# Patient Record
Sex: Female | Born: 1990 | Race: White | Hispanic: No | Marital: Single | Smoking: Current every day smoker
Health system: Southern US, Community
[De-identification: ages and names within clinical notes are randomized; demographics above are authoritative.]

## PROBLEM LIST (undated history)

## (undated) ENCOUNTER — Inpatient Hospital Stay: Payer: Self-pay

## (undated) DIAGNOSIS — G43909 Migraine, unspecified, not intractable, without status migrainosus: Secondary | ICD-10-CM

## (undated) DIAGNOSIS — D649 Anemia, unspecified: Secondary | ICD-10-CM

## (undated) DIAGNOSIS — Z8489 Family history of other specified conditions: Secondary | ICD-10-CM

## (undated) DIAGNOSIS — F419 Anxiety disorder, unspecified: Secondary | ICD-10-CM

## (undated) DIAGNOSIS — F329 Major depressive disorder, single episode, unspecified: Secondary | ICD-10-CM

## (undated) DIAGNOSIS — F32A Depression, unspecified: Secondary | ICD-10-CM

---
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
## 2013-08-29 ENCOUNTER — Emergency Department: Payer: Self-pay | Admitting: Emergency Medicine

## 2013-11-16 ENCOUNTER — Emergency Department: Payer: Self-pay | Admitting: Emergency Medicine

## 2013-12-31 ENCOUNTER — Emergency Department: Payer: Self-pay | Admitting: Emergency Medicine

## 2014-01-03 LAB — BETA STREP CULTURE(ARMC)

## 2014-02-12 ENCOUNTER — Emergency Department: Payer: Self-pay | Admitting: Internal Medicine

## 2014-02-12 LAB — COMPREHENSIVE METABOLIC PANEL
ANION GAP: 2 — AB (ref 7–16)
Albumin: 4 g/dL (ref 3.4–5.0)
Alkaline Phosphatase: 85 U/L
BUN: 14 mg/dL (ref 7–18)
Bilirubin,Total: 1.1 mg/dL — ABNORMAL HIGH (ref 0.2–1.0)
CHLORIDE: 112 mmol/L — AB (ref 98–107)
Calcium, Total: 8.7 mg/dL (ref 8.5–10.1)
Co2: 25 mmol/L (ref 21–32)
Creatinine: 0.86 mg/dL (ref 0.60–1.30)
EGFR (Non-African Amer.): >60
Glucose: 76 mg/dL (ref 65–99)
Osmolality: 277 (ref 275–301)
Potassium: 3.4 mmol/L — ABNORMAL LOW (ref 3.5–5.1)
SGOT(AST): 20 U/L (ref 15–37)
SGPT (ALT): 15 U/L
Sodium: 139 mmol/L (ref 136–145)
TOTAL PROTEIN: 7.2 g/dL (ref 6.4–8.2)

## 2014-02-12 LAB — CBC
HCT: 40.6 % (ref 35.0–47.0)
HGB: 13.6 g/dL (ref 12.0–16.0)
MCH: 28.6 pg (ref 26.0–34.0)
MCHC: 33.4 g/dL (ref 32.0–36.0)
MCV: 86 fL (ref 80–100)
Platelet: 199 10*3/uL (ref 150–440)
RBC: 4.74 10*6/uL (ref 3.80–5.20)
RDW: 13.4 % (ref 11.5–14.5)
WBC: 9.3 10*3/uL (ref 3.6–11.0)

## 2014-02-12 LAB — TROPONIN I

## 2014-02-12 LAB — D-DIMER(ARMC): D-Dimer: 355 ng/ml

## 2014-04-14 ENCOUNTER — Emergency Department: Payer: Self-pay | Admitting: Emergency Medicine

## 2014-04-14 LAB — BASIC METABOLIC PANEL
ANION GAP: 10 (ref 7–16)
BUN: 10 mg/dL (ref 7–18)
CALCIUM: 8.9 mg/dL (ref 8.5–10.1)
CHLORIDE: 106 mmol/L (ref 98–107)
CO2: 22 mmol/L (ref 21–32)
Creatinine: 0.71 mg/dL (ref 0.60–1.30)
EGFR (Non-African Amer.): >60
GLUCOSE: 100 mg/dL — AB (ref 65–99)
OSMOLALITY: 275 (ref 275–301)
Potassium: 3.6 mmol/L (ref 3.5–5.1)
Sodium: 138 mmol/L (ref 136–145)

## 2014-04-14 LAB — CBC
HCT: 39.4 % (ref 35.0–47.0)
HGB: 12.8 g/dL (ref 12.0–16.0)
MCH: 28.4 pg (ref 26.0–34.0)
MCHC: 32.6 g/dL (ref 32.0–36.0)
MCV: 87 fL (ref 80–100)
Platelet: 167 10*3/uL (ref 150–440)
RBC: 4.53 10*6/uL (ref 3.80–5.20)
RDW: 12.9 % (ref 11.5–14.5)
WBC: 12.5 10*3/uL — ABNORMAL HIGH (ref 3.6–11.0)

## 2014-04-14 LAB — MONONUCLEOSIS SCREEN: MONO TEST: NEGATIVE

## 2014-09-21 NOTE — H&P (Signed)
L&D Evaluation:  History:   HPI 24 yo G1 at [redacted]w[redacted]d GA by LMP and 9 week u/s.  Pregnancy complicated by oligohydramnios.  She had preterm contractions in January and had a positive fetal fibronectin and received BMTZ x2. Patient has personal history of depression/anxiety, smoking 1/2 ppd.  She presents for induction for oligohydramnios.  She notes +FM, no VB, no LOF, no contractions. O+, RPR NR, RI, HBsAg neg.    Patient's Medical History migraines    Patient's Surgical History none    Medications Pre Natal Vitamins    Allergies NKDA    Social History tobacco  7-8 cigs/day    Family History Non-Contributory   ROS:   ROS negative unless noted in HPI   Exam:   General no apparent distress    Mental Status clear    Chest clear    Heart normal sinus rhythm    Abdomen gravid, non-tender    Estimated Fetal Weight about 6# by [place]    Back no CVAT    Edema no edema    Pelvic no external lesions, 1cm per RN    Mebranes Intact    FHT normal rate with no decels    FHT Description 135/mod var/+accels/no decels    Ucx irregular, 2-3 ctx q 10 min    Skin no lesions    Lymph no lymphadenopathy   Impression:   Impression Induction of labor for oligohydramnios   Plan:   Comments cervadil for ripening cbc/t&s ivf ambien for sleep start pit in AM   Electronic Signatures: Will Bonnet (MD)  (Signed 15-Mar-13 22:00)  Authored: L&D Evaluation   Last Updated: 15-Mar-13 22:00 by Will Bonnet (MD)

## 2014-09-21 NOTE — H&P (Signed)
L&D Evaluation:  History Expanded:   HPI 24 yo G1 P0, EDD of 08/04/11 per LMP and 19 week [place]. PNC at [facility] notable for teen pregnancy, smoking, ASB tx with Macrobid, iregular maternal HR with nl EKG and low AFI at 33weeks. Pt has been evaluated for several episodes of PTL in L&D and received steroids last week.  Pt presents today to L&D at 34 4/7 weeks with c/o contractions last night with lessened intensity today and decreased FM today. Denies LOF or VB. [facility] called while pt in hospital stating pt needed f/u AFI this week.    Blood Type O positive    Group B Strep Results (Result >5wks must be treated as unknown) unknown/result > 5 weeks ago     Maternal HIV Negative    Maternal Syphilis Ab Nonreactive    Maternal Varicella Immune    Rubella Results immune    Maternal T-Dap Immune    Patient's Medical History depression     Patient's Surgical History none     Medications Pre Natal Vitamins     Allergies NKDA    Social History tobacco    Exam:   Vital Signs stable     General no apparent distress    Mental Status clear     Chest clear     Heart no murmur/gallop/rubs    Abdomen gravid, non-tender    Estimated Fetal Weight Average for gestational age    Fetal Position vertex    Edema no edema     Pelvic no external lesions, cervix closed and thick    Mebranes Intact    FHT normal rate with no decels    Ucx initially regular, rare after rest and hydration    Other AFI 12.69 cm UA: negative   Impression:   Impression arrest of preterm contractions, normal AFI   Plan:   Comments D/c home Pt will f/u at [facility] tomorrow as scheduled.   Electronic Signatures: Ander Purpura (CNM)  (Signed 13-Feb-13 13:26)  Authored: L&D Evaluation   Last Updated: 13-Feb-13 13:26 by Ander Purpura (CNM)

## 2014-09-21 NOTE — H&P (Signed)
L&D Evaluation:  History:   HPI 24 year old G1P0 with EDD 08/04/11 per LMP.  Presents to L&D with main c/o cramping.  [place] [facility] for early entry to care and UTI that was treated with antibiotics.    Presents with abdominal pain    Patient's Medical History migraines, anxiety    Patient's Surgical History none    Medications Pre Natal Vitamins    Allergies NKDA    Social History none    Family History Non-Contributory   ROS:   ROS All systems were reviewed.  HEENT, CNS, GI, GU, Respiratory, CV, Renal and Musculoskeletal systems were found to be normal.   Exam:   Vital Signs stable    General no apparent distress, sleeping upon entering room    Mental Status clear    Chest clear    Heart normal sinus rhythm    Abdomen gravid, non-tender    Estimated Fetal Weight Average for gestational age    Back no CVAT    Edema no edema    Pelvic no external lesions, cervix closed and thick    Mebranes Intact    FHT normal rate with no decels    Ucx absent, 1 ctx noted when pt first arrived    Skin dry    Lymph no lymphadenopathy   Impression:   Impression other, normal discomforts of pregnancy   Plan:   Plan UA, EFM/NST, discharge    Comments While pt here her mother states that she has "chest pains" alot related to her anxiety but she won't take any medications.  She has been prescribed Buspar but is afraid to take it while pregnant.  Encouraged pt to take medication if she feels she needs it.  Also discussed nornal discomforts of pregnancy, staying hydrated and increasing fluid intake.    Follow Up Appointment already [facility]   Electronic Signatures: Shann Medal (CNM)  (Signed 09-Jan-13 21:29)  Authored: L&D Evaluation   Last Updated: 09-Jan-13 21:29 by Shann Medal (CNM)

## 2014-09-21 NOTE — H&P (Signed)
L&D Evaluation:  History Expanded:   HPI 24 yo G1 whose EDC = 08/04/11.  Pt presents with contractions and passage of "show".    Blood Type O positive    Maternal HIV Negative    Maternal Syphilis Ab Nonreactive    Rubella Results immune    EDC 04-Aug-2011    Presents with contractions    Patient's Medical History No Chronic Illness  recent antibiotics for "bronchitis"    Patient's Surgical History none    Medications Pre [place] Vitamins  antibiotic for bronchitis    Allergies NKDA    Social History tobacco   Exam:   Vital Signs stable    General no apparent distress    Mental Status clear    Chest clear    Heart normal sinus rhythm    Abdomen gravid, non-tender    Estimated Fetal Weight Average for gestational age    FHT normal rate with no decels    Ucx irregular   Plan:   Plan ?? PTL ??    Comments FFN, hydrate, observtion, Betamethasone   Electronic Signatures: Rosina Lowenstein (MD)  (Signed 05-Feb-13 14:03)  Authored: L&D Evaluation   Last Updated: 05-Feb-13 14:03 by Rosina Lowenstein (MD)

## 2014-12-15 ENCOUNTER — Encounter: Payer: Self-pay | Admitting: Emergency Medicine

## 2014-12-15 DIAGNOSIS — Z720 Tobacco use: Secondary | ICD-10-CM | POA: Insufficient documentation

## 2014-12-15 DIAGNOSIS — G43909 Migraine, unspecified, not intractable, without status migrainosus: Secondary | ICD-10-CM | POA: Insufficient documentation

## 2014-12-15 NOTE — ED Notes (Signed)
Patient ambulatory to triage with steady gait, without difficulty or distress noted; pt reports generalized HA x 3 days; taking excedrin migraine without relief which normally helps; st hx migraines

## 2014-12-16 ENCOUNTER — Emergency Department
Admission: EM | Admit: 2014-12-16 | Discharge: 2014-12-16 | Discharge status: Against Medical Advice | Payer: Self-pay | Attending: Emergency Medicine | Admitting: Emergency Medicine

## 2014-12-16 HISTORY — DX: Migraine, unspecified, not intractable, without status migrainosus: G43.909

## 2016-01-06 ENCOUNTER — Emergency Department
Admission: EM | Admit: 2016-01-06 | Discharge: 2016-01-06 | Disposition: A | Discharge status: Other Acute Inpatient Hospital | Payer: Medicaid Other | Attending: Emergency Medicine | Admitting: Emergency Medicine

## 2016-01-06 ENCOUNTER — Inpatient Hospital Stay
Admit: 2016-01-06 | Discharge: 2016-01-08 | DRG: 881 | Disposition: A | Discharge status: Home / Self Care | Payer: Medicaid Other | Source: Intra-hospital | Attending: Psychiatry | Admitting: Psychiatry

## 2016-01-06 ENCOUNTER — Emergency Department: Payer: Medicaid Other

## 2016-01-06 DIAGNOSIS — O0281 Inappropriate change in quantitative human chorionic gonadotropin (hCG) in early pregnancy: Secondary | ICD-10-CM | POA: Diagnosis not present

## 2016-01-06 DIAGNOSIS — O99340 Other mental disorders complicating pregnancy, unspecified trimester: Secondary | ICD-10-CM | POA: Diagnosis present

## 2016-01-06 DIAGNOSIS — F4321 Adjustment disorder with depressed mood: Secondary | ICD-10-CM

## 2016-01-06 DIAGNOSIS — O99320 Drug use complicating pregnancy, unspecified trimester: Secondary | ICD-10-CM | POA: Insufficient documentation

## 2016-01-06 DIAGNOSIS — F410 Panic disorder [episodic paroxysmal anxiety] without agoraphobia: Secondary | ICD-10-CM | POA: Diagnosis present

## 2016-01-06 DIAGNOSIS — F17200 Nicotine dependence, unspecified, uncomplicated: Secondary | ICD-10-CM

## 2016-01-06 DIAGNOSIS — Z3201 Encounter for pregnancy test, result positive: Secondary | ICD-10-CM | POA: Insufficient documentation

## 2016-01-06 DIAGNOSIS — F17210 Nicotine dependence, cigarettes, uncomplicated: Secondary | ICD-10-CM | POA: Diagnosis present

## 2016-01-06 DIAGNOSIS — Z3490 Encounter for supervision of normal pregnancy, unspecified, unspecified trimester: Secondary | ICD-10-CM

## 2016-01-06 DIAGNOSIS — F1410 Cocaine abuse, uncomplicated: Secondary | ICD-10-CM | POA: Diagnosis present

## 2016-01-06 DIAGNOSIS — O99330 Smoking (tobacco) complicating pregnancy, unspecified trimester: Secondary | ICD-10-CM | POA: Diagnosis not present

## 2016-01-06 DIAGNOSIS — R45851 Suicidal ideations: Secondary | ICD-10-CM

## 2016-01-06 DIAGNOSIS — Z818 Family history of other mental and behavioral disorders: Secondary | ICD-10-CM | POA: Diagnosis not present

## 2016-01-06 DIAGNOSIS — Z3A00 Weeks of gestation of pregnancy not specified: Secondary | ICD-10-CM | POA: Insufficient documentation

## 2016-01-06 DIAGNOSIS — Z5181 Encounter for therapeutic drug level monitoring: Secondary | ICD-10-CM | POA: Insufficient documentation

## 2016-01-06 DIAGNOSIS — F1290 Cannabis use, unspecified, uncomplicated: Secondary | ICD-10-CM | POA: Diagnosis not present

## 2016-01-06 DIAGNOSIS — G43909 Migraine, unspecified, not intractable, without status migrainosus: Secondary | ICD-10-CM | POA: Diagnosis present

## 2016-01-06 HISTORY — DX: Major depressive disorder, single episode, unspecified: F32.9

## 2016-01-06 HISTORY — DX: Depression, unspecified: F32.A

## 2016-01-06 HISTORY — DX: Anxiety disorder, unspecified: F41.9

## 2016-01-06 LAB — SALICYLATE LEVEL: SALICYLATE LVL: 4.5 mg/dL (ref 2.8–30.0)

## 2016-01-06 LAB — URINE DRUG SCREEN, QUALITATIVE (ARMC ONLY)
Amphetamines, Ur Screen: NOT DETECTED
BARBITURATES, UR SCREEN: NOT DETECTED
BENZODIAZEPINE, UR SCRN: NOT DETECTED
Cannabinoid 50 Ng, Ur ~~LOC~~: NOT DETECTED
Cocaine Metabolite,Ur ~~LOC~~: POSITIVE — AB
MDMA (Ecstasy)Ur Screen: NOT DETECTED
METHADONE SCREEN, URINE: NOT DETECTED
Opiate, Ur Screen: NOT DETECTED
Phencyclidine (PCP) Ur S: NOT DETECTED
TRICYCLIC, UR SCREEN: NOT DETECTED

## 2016-01-06 LAB — CBC
HCT: 44.4 % (ref 35.0–47.0)
Hemoglobin: 15.4 g/dL (ref 12.0–16.0)
MCH: 30.1 pg (ref 26.0–34.0)
MCHC: 34.8 g/dL (ref 32.0–36.0)
MCV: 86.4 fL (ref 80.0–100.0)
Platelets: 213 10*3/uL (ref 150–440)
RBC: 5.14 MIL/uL (ref 3.80–5.20)
RDW: 12.8 % (ref 11.5–14.5)
WBC: 9.4 10*3/uL (ref 3.6–11.0)

## 2016-01-06 LAB — COMPREHENSIVE METABOLIC PANEL
ALK PHOS: 90 U/L (ref 38–126)
ALT: 13 U/L — ABNORMAL LOW (ref 14–54)
ANION GAP: 8 (ref 5–15)
AST: 23 U/L (ref 15–41)
Albumin: 4.8 g/dL (ref 3.5–5.0)
BILIRUBIN TOTAL: 1.4 mg/dL — AB (ref 0.3–1.2)
BUN: 11 mg/dL (ref 6–20)
CALCIUM: 9.3 mg/dL (ref 8.9–10.3)
CO2: 27 mmol/L (ref 22–32)
Chloride: 105 mmol/L (ref 101–111)
Creatinine, Ser: 0.85 mg/dL (ref 0.44–1.00)
GFR calc non Af Amer: >60 mL/min (ref >60)
Glucose, Bld: 96 mg/dL (ref 65–99)
POTASSIUM: 3 mmol/L — AB (ref 3.5–5.1)
SODIUM: 140 mmol/L (ref 135–145)
TOTAL PROTEIN: 8.2 g/dL — AB (ref 6.5–8.1)

## 2016-01-06 LAB — HCG, QUANTITATIVE, PREGNANCY: HCG, BETA CHAIN, QUANT, S: 17 m[IU]/mL — AB (ref <5)

## 2016-01-06 LAB — ACETAMINOPHEN LEVEL

## 2016-01-06 LAB — ETHANOL: Alcohol, Ethyl (B): <5 mg/dL (ref <5)

## 2016-01-06 LAB — POCT PREGNANCY, URINE: PREG TEST UR: POSITIVE — AB

## 2016-01-06 LAB — ABO/RH: ABO/RH(D): O POS

## 2016-01-06 MED ORDER — OCUVITE-LUTEIN PO CAPS
1.0000 | ORAL_CAPSULE | Freq: Every day | ORAL | Status: DC
  Administered 2016-01-07 – 2016-01-08 (×2): 1 via ORAL
  Filled 2016-01-06 (×2): qty 1

## 2016-01-06 MED ORDER — MAGNESIUM HYDROXIDE 400 MG/5ML PO SUSP: 30.0000 mL | Freq: Every day | ORAL | Status: DC | PRN

## 2016-01-06 MED ORDER — ACETAMINOPHEN 325 MG PO TABS: 650.0000 mg | ORAL_TABLET | Freq: Four times a day (QID) | ORAL | Status: DC | PRN

## 2016-01-06 MED ORDER — ALUM & MAG HYDROXIDE-SIMETH 200-200-20 MG/5ML PO SUSP: 30.0000 mL | ORAL | Status: DC | PRN

## 2016-01-06 MED ORDER — HYDROXYZINE HCL 25 MG PO TABS
25.0000 mg | ORAL_TABLET | Freq: Four times a day (QID) | ORAL | Status: DC | PRN
  Administered 2016-01-07: 25 mg via ORAL
  Filled 2016-01-06: qty 1

## 2016-01-06 MED ORDER — PROSIGHT PO TABS: 1.0000 | ORAL_TABLET | Freq: Every day | ORAL | Status: DC

## 2016-01-06 MED ORDER — DIPHENHYDRAMINE HCL 25 MG PO CAPS
50.0000 mg | ORAL_CAPSULE | Freq: Every evening | ORAL | Status: DC | PRN
  Administered 2016-01-07: 50 mg via ORAL
  Filled 2016-01-06: qty 2

## 2016-01-06 MED ORDER — DIPHENHYDRAMINE HCL 25 MG PO CAPS: 50.0000 mg | ORAL_CAPSULE | Freq: Every evening | ORAL | Status: DC | PRN

## 2016-01-06 MED ORDER — HYDROXYZINE HCL 25 MG PO TABS: 25.0000 mg | ORAL_TABLET | Freq: Four times a day (QID) | ORAL | Status: DC | PRN

## 2016-01-06 NOTE — ED Provider Notes (Signed)
[facility] Emergency Department Provider Note  Time seen: 4:02 PM  I have reviewed the triage vital signs and the nursing notes.   HISTORY  Chief Complaint Suicidal    HPI Toni Blake is a 25 y.o. female with no past medical history who presents the emergency department with suicidal thoughts. According to the patient she recently cheated on her boyfriend of 9 years, states her family will not talk to her, she is now having thoughts of killing herself. She states she was in the shower this morning and she was thinking of cutting herself with a razor blade. Patient states no history of psychiatric diseases, no medical complaints. Patient denies alcohol or drug use.  Past Medical History:  Diagnosis Date  . Migraine     There are no active problems to display for this patient.   History reviewed. No pertinent surgical history.  Prior to Admission medications   Not on File    No Known Allergies  No family history on file.  Social History Social History  Substance Use Topics  . Smoking status: Current Every Day Smoker    Packs/day: 0.50    Types: Cigarettes  . Smokeless tobacco: Never Used  . Alcohol use No    Review of Systems Constitutional: Negative for fever. Cardiovascular: Negative for chest pain. Respiratory: Negative for shortness of breath. Gastrointestinal: Negative for abdominal pain Neurological: Negative for headache 10-point ROS otherwise negative.  ____________________________________________   PHYSICAL EXAM:  VITAL SIGNS: ED Triage Vitals  Enc Vitals Group     BP 01/06/16 1445 134/87     Pulse Rate 01/06/16 1445 (!) 108     Resp 01/06/16 1445 18     Temp 01/06/16 1445 97.9 F (36.6 C)     Temp Source 01/06/16 1445 Oral     SpO2 01/06/16 1445 100 %     Weight 01/06/16 1446 115 lb (52.2 kg)     Height 01/06/16 1446 5' (1.524 m)     Head Circumference --      Peak Flow --      Pain Score --      Pain Loc  --      Pain Edu? --      Excl. in [place]? --     Constitutional: Alert and oriented. Well appearing and in no distress. Eyes: Normal exam ENT   Head: Normocephalic and atraumatic.   Mouth/Throat: Mucous membranes are moist. Cardiovascular: Normal rate, regular rhythm. No murmur Respiratory: Normal respiratory effort without tachypnea nor retractions. Breath sounds are clear Gastrointestinal: Soft and nontender. No distention. Musculoskeletal: Nontender with normal range of motion in all extremities. Neurologic:  Normal speech and language. No gross focal neurologic deficits  Skin:  Skin is warm, dry and intact.  Psychiatric: Flat affect, states suicidal ideation.  ____________________________________________    INITIAL IMPRESSION / ASSESSMENT AND PLAN / ED COURSE  Pertinent labs & imaging results that were available during my care of the patient were reviewed by me and considered in my medical decision making (see chart for details).  The patient presents the emergency department with suicidal ideations after recently cheating on her boyfriend of 9 years. Patient states her family and friends will no longer speak with her and she is having thoughts of cutting herself with a razor blade. We will place the patient under an involuntary commitment, have psychiatry see the patient to decide upon an appropriate disposition.  Patient's medical workup shows a positive pregnancy test. Otherwise within  normal limits. The patient was unaware she was pregnant. I have added on a quantitative beta hCG, the patient states she just came off of her period yesterday which would be consistent with a threatened miscarriage, have also added an ABO/Rh. The beta-hCG is elevated we will proceed with a pelvic ultrasound.  Patient's beta-hCG has resulted at 17, consistent with either miscarriage or very early pregnancy, ultrasound does not note any IUP. We will repeat a beta hCG tomorrow as the patient is  still admitted to the hospital. I discussed the patient with psychiatry, they plan to admit to their service for further treatment. Patient does not require any further OB care at this time besides a repeat beta-hCG.  ____________________________________________   FINAL CLINICAL IMPRESSION(S) / ED DIAGNOSES  Suicidal ideation Positive pregnancy test   Harvest Dark, MD 01/06/16 1921

## 2016-01-06 NOTE — ED Triage Notes (Signed)
Pt was dropped off by her sister.. Pt is having suicidal thoughts, states recently cheated on her husband and states "everyone hates me".. Denies any self harm prior to arrival and does not have a plan at present.Marland Kitchen

## 2016-01-06 NOTE — Progress Notes (Signed)
Pt being reviewed for possible admission to [facility]. H&P and Assessment have been faxed to the [facility] for the charge nurse to review and provide bed assignment.      Con Memos, MS, [place], [facility] Therapeutic Triage Specialist

## 2016-01-06 NOTE — ED Notes (Signed)
IVC/Consult completed/pending disposition

## 2016-01-06 NOTE — BH Assessment (Signed)
Assessment Note  Toni Blake is an 25 y.o. female. Who has been transported voluntarily to the ED by her sister seeking an psychiatric evaluation.  Pt reports having suicidal thoughts x 2 days and reports being depressed x 2weeks. Pt states that she has had thoughts of driving her car off of a bridge although she denies intent.  Pt reports that she spends most of her day in the bed and is constantly tearful.  Pt states that she recently cheated on her fiancee. Pt reports little to no supports. Pt reports that she is currently unemployed and is living with her young child and ex-fiancee. Pt. denies the presence of any auditory or visual hallucinations at this time. Patient denies any other medical complaints. Pt presenting with impaired insight, judgement and impulse control, further evaluation is recommended. The patient does meet admission criteria at this time. This was explained to the pt, who voiced understanding.    Diagnosis: Adjustment Disorder   Past Medical History:  Past Medical History:  Diagnosis Date  . Migraine     History reviewed. No pertinent surgical history.  Family History: No family history on file.  Social History:  reports that she has been smoking Cigarettes.  She has been smoking about 0.50 packs per day. She has never used smokeless tobacco. She reports that she uses drugs, including Marijuana. She reports that she does not drink alcohol.  Additional Social History:  Alcohol / Drug Use Pain Medications: See PTA  Prescriptions: See PTA Over the Counter: See PTA History of alcohol / drug use?: Yes Longest period of sobriety (when/how long): Unknown  Negative Consequences of Use:  (None Noted) Withdrawal Symptoms:  (None Noted) Substance #1 Name of Substance 1: THC 1 - Age of First Use: 25 y.o. 1 - Amount (size/oz): 20$ worth  1 - Frequency: Every other week  1 - Duration: 7 years 1 - Last Use / Amount: x 1 week  CIWA: CIWA-Ar BP: 134/87 Pulse Rate:  (!) 108 COWS:    Allergies: No Known Allergies  Home Medications:  (Not in a hospital admission)  OB/GYN Status:  Patient's last menstrual period was 01/02/2016 (exact date).  General Assessment Data Location of Assessment: [facility] ED TTS Assessment: In system Is this a Tele or Face-to-Face Assessment?: Face-to-Face Is this an Initial Assessment or a Re-assessment for this encounter?: Initial Assessment Marital status: Long term relationship Is patient pregnant?: Yes Pregnancy Status: Yes (Comment: include estimated delivery date) Living Arrangements: Spouse/significant other, Children Can pt return to current living arrangement?: Yes Admission Status: Involuntary Is patient capable of signing voluntary admission?: No Referral Source: Self/Family/Friend Insurance type: None   Medical Screening Exam ([place]) Medical Exam completed: Yes  Crisis Care Plan Living Arrangements: Spouse/significant other, Children Legal Guardian: Other: (None ) Name of Psychiatrist: None  Name of Therapist: None   Education Status Is patient currently in school?: No Current Grade: N/A Highest grade of school patient has completed: HS Name of school: N/A Contact person: N/A  Risk to self with the past 6 months Suicidal Ideation: Yes-Currently Present Has patient been a risk to self within the past 6 months prior to admission? : Yes Suicidal Intent: No-Not Currently/Within Last 6 Months Has patient had any suicidal intent within the past 6 months prior to admission? : No Is patient at risk for suicide?: Yes Suicidal Plan?: No-Not Currently/Within Last 6 Months Has patient had any suicidal plan within the past 6 months prior to admission? : Yes Access  to Means: Yes Specify Access to Suicidal Means: To drive her car off of a bridge  What has been your use of drugs/alcohol within the last 12 months?: THC  Previous Attempts/Gestures: No How many times?: 0 Other Self Harm Risks:  0 Triggers for Past Attempts: Other (Comment) (None) Intentional Self Injurious Behavior: None Family Suicide History: No Recent stressful life event(s): Turmoil (Comment), Legal Issues, Conflict (Comment) Persecutory voices/beliefs?: No Depression: Yes Depression Symptoms: Tearfulness, Insomnia, Fatigue, Loss of interest in usual pleasures, Feeling worthless/self pity Substance abuse history and/or treatment for substance abuse?: No Suicide prevention information given to non-admitted patients: Yes  Risk to Others within the past 6 months Homicidal Ideation: No Does patient have any lifetime risk of violence toward others beyond the six months prior to admission? : No Thoughts of Harm to Others: No Current Homicidal Intent: No Current Homicidal Plan: No Access to Homicidal Means: No Identified Victim: N/A History of harm to others?: No Assessment of Violence: None Noted Violent Behavior Description: N/A Does patient have access to weapons?: No Criminal Charges Pending?: No Does patient have a court date: No Is patient on probation?: No  Psychosis Hallucinations: None noted Delusions: None noted  Mental Status Report Appearance/Hygiene: In scrubs, Unremarkable Eye Contact: Good Motor Activity: Freedom of movement Speech: Logical/coherent Level of Consciousness: Alert Mood: Depressed Affect: Depressed Anxiety Level: None Thought Processes: Relevant Judgement: Partial Orientation: Time, Place, Person, Situation Obsessive Compulsive Thoughts/Behaviors: None  Cognitive Functioning Concentration: Decreased Memory: Remote Intact, Recent Intact IQ: Average Insight: Fair Impulse Control: Fair Appetite: Poor Weight Loss: 10 Weight Gain: 0 Sleep: Decreased Total Hours of Sleep: 3 Vegetative Symptoms: Staying in bed  ADLScreening [facility] Assessment Services) Patient's cognitive ability adequate to safely complete daily activities?: Yes Patient able to express need for  assistance with ADLs?: Yes Independently performs ADLs?: Yes (appropriate for developmental age)  Prior Inpatient Therapy Prior Inpatient Therapy: No Prior Therapy Dates: none noted Prior Therapy Facilty/Provider(s): none noted Reason for Treatment: none noted  Prior Outpatient Therapy Prior Outpatient Therapy: No Prior Therapy Dates: None  Prior Therapy Facilty/Provider(s): None  Reason for Treatment: None  Does patient have an ACCT team?: No Does patient have Intensive In-House Services?  : No Does patient have [facility] services? : No Does patient have P4CC services?: No  ADL Screening (condition at time of admission) Patient's cognitive ability adequate to safely complete daily activities?: Yes Patient able to express need for assistance with ADLs?: Yes Independently performs ADLs?: Yes (appropriate for developmental age)       Abuse/Neglect Assessment (Assessment to be complete while patient is alone) Physical Abuse: Denies Verbal Abuse: Denies Sexual Abuse: Denies Exploitation of patient/patient's resources: Denies Self-Neglect: Denies Values / Beliefs Cultural Requests During Hospitalization: None Spiritual Requests During Hospitalization: None Consults Spiritual Care Consult Needed: No Social Work Consult Needed: No Regulatory affairs officer (For Healthcare) Does patient have an advance directive?: No Would patient like information on creating an advanced directive?: No - patient declined information    Additional Information 1:1 In Past 12 Months?: No CIRT Risk: No Elopement Risk: No Does patient have medical clearance?: Yes     Disposition:  Disposition Initial Assessment Completed for this Encounter: Yes Disposition of Patient: Inpatient treatment program  On Site Evaluation by:   Reviewed with Physician:    Laretta Alstrom 01/06/2016 7:09 PM

## 2016-01-06 NOTE — ED Notes (Signed)
Report called to [facility] RN in [facility] behavioral health at this time.

## 2016-01-06 NOTE — Consult Note (Addendum)
[facility] Consult   Reason for Consult:  Consult for 25 year old woman who presented voluntarily to the emergency room with acute depression Referring Physician:  Paduchowski Patient Identification: Toni Blake MRN:  [id number] Principal Diagnosis: Adjustment disorder with depressed mood Diagnosis:   Patient Active Problem List   Diagnosis Date Noted  . Adjustment disorder with depressed mood [F43.21] 01/06/2016  . Suicidal ideation [R45.851] 01/06/2016  . Migraine [G43.909] 01/06/2016  . Cocaine abuse [F14.10] 01/06/2016  . Pregnancy [Z33.1] 01/06/2016    Total Time spent with patient: 1 hour  Subjective:   Toni Blake is a 25 y.o. female patient admitted with "I cheated on my fiance and now everybody hates me".  HPI:  Patient interviewed. Chart reviewed. Labs and vitals reviewed. 25 year old woman presented to the emergency room requesting help with depression. She reports her mood is sad and down and depressed. She's been feeling bad for about 2 weeks. She has trouble sleeping at night and has not slept very well at all for the last couple days. Appetite has disappeared and she's not been eating well. Tearful much of the time. Feels hopeless. Last couple days has started to have suicidal thoughts with thoughts of driving her car off a bridge. She is not having hallucinations or psychotic symptoms. The big precipitating stress is that the patient had cheated on her fianc and this information just got out within the last week when her brother decided to tell everyone. This caused a huge uproar among the patient's family and now she says no one in her family except her sister is speaking to her anymore. Her fianc actually has stuck by her and she is staying with him. The patient has a 31-year-old daughter and has been trying to take care of girl but is feeling more and more depressed and withdrawn and hopeless. Patient says she smokes marijuana but denies any  other drug abuse although her drug screen is positive for cocaine.  Social history: Currently not working outside the home. Has a 58-year-old daughter. Lives with her fianc for the time being. Estranged from her family as noted above.  Medical history: Migraine headaches. Says they are relatively rare until the last 2 weeks and now she's had a headache almost every day. Patient just found out here in the emergency room that she has a positive pregnancy test.  Substance abuse history: Says she drinks rarely. Smokes marijuana she claims only about once every 2 weeks. Patient denies that she uses any other drugs although her drug screen is positive for cocaine.  Past Psychiatric History: Patient says she was seen by a therapist when she was an adolescent because of her parents divorce and was also on medicine at the time. Doesn't remember what the medicine was. Hasn't had any other psychiatric treatment since then. Never been in a psychiatric hospital. Denies any history of suicide attempts or psychosis.  Risk to Self: Is patient at risk for suicide?: Yes Risk to Others:   Prior Inpatient Therapy:   Prior Outpatient Therapy:    Past Medical History:  Past Medical History:  Diagnosis Date  . Migraine    History reviewed. No pertinent surgical history. Family History: No family history on file. Family Psychiatric  History: Thinks that her mother has had depression but doesn't know of any other family history of mental illness Social History:  History  Alcohol Use No     History  Drug Use  . Types: Marijuana    Social History  Social History  . Marital status: Single    Spouse name: N/A  . Number of children: N/A  . Years of education: N/A   Social History Main Topics  . Smoking status: Current Every Day Smoker    Packs/day: 0.50    Types: Cigarettes  . Smokeless tobacco: Never Used  . Alcohol use No  . Drug use:     Types: Marijuana  . Sexual activity: Not Asked   Other  Topics Concern  . None   Social History Narrative  . None   Additional Social History:    Allergies:  No Known Allergies  Labs:  Results for orders placed or performed during the hospital encounter of 01/06/16 (from the past 48 hour(s))  Comprehensive metabolic panel     Status: Abnormal   Collection Time: 01/06/16  2:54 PM  Result Value Ref Range   Sodium 140 135 - 145 mmol/L   Potassium 3.0 (L) 3.5 - 5.1 mmol/L   Chloride 105 101 - 111 mmol/L   CO2 27 22 - 32 mmol/L   Glucose, Bld 96 65 - 99 mg/dL   BUN 11 6 - 20 mg/dL   Creatinine, Ser 0.85 0.44 - 1.00 mg/dL   Calcium 9.3 8.9 - 10.3 mg/dL   Total Protein 8.2 (H) 6.5 - 8.1 g/dL   Albumin 4.8 3.5 - 5.0 g/dL   AST 23 15 - 41 U/L   ALT 13 (L) 14 - 54 U/L   Alkaline Phosphatase 90 38 - 126 U/L   Total Bilirubin 1.4 (H) 0.3 - 1.2 mg/dL   GFR calc non Af Amer >60 >60 mL/min   GFR calc Af Amer >60 >60 mL/min    Comment: (NOTE) The eGFR has been calculated using the CKD EPI equation. This calculation has not been validated in all clinical situations. eGFR's persistently <60 mL/min signify possible Chronic Kidney Disease.    Anion gap 8 5 - 15  Ethanol     Status: None   Collection Time: 01/06/16  2:54 PM  Result Value Ref Range   Alcohol, Ethyl (B) <5 <5 mg/dL    Comment:        LOWEST DETECTABLE LIMIT FOR SERUM ALCOHOL IS 5 mg/dL FOR MEDICAL PURPOSES ONLY   Salicylate level     Status: None   Collection Time: 01/06/16  2:54 PM  Result Value Ref Range   Salicylate Lvl 4.5 2.8 - 30.0 mg/dL  Acetaminophen level     Status: Abnormal   Collection Time: 01/06/16  2:54 PM  Result Value Ref Range   Acetaminophen (Tylenol), Serum <10 (L) 10 - 30 ug/mL    Comment:        THERAPEUTIC CONCENTRATIONS VARY SIGNIFICANTLY. A RANGE OF 10-30 ug/mL MAY BE AN EFFECTIVE CONCENTRATION FOR MANY PATIENTS. HOWEVER, SOME ARE BEST TREATED AT CONCENTRATIONS OUTSIDE THIS RANGE. ACETAMINOPHEN CONCENTRATIONS >150 ug/mL AT 4 HOURS  AFTER INGESTION AND >50 ug/mL AT 12 HOURS AFTER INGESTION ARE OFTEN ASSOCIATED WITH TOXIC REACTIONS.   cbc     Status: None   Collection Time: 01/06/16  2:54 PM  Result Value Ref Range   WBC 9.4 3.6 - 11.0 K/uL   RBC 5.14 3.80 - 5.20 MIL/uL   Hemoglobin 15.4 12.0 - 16.0 g/dL   HCT 44.4 35.0 - 47.0 %   MCV 86.4 80.0 - 100.0 fL   MCH 30.1 26.0 - 34.0 pg   MCHC 34.8 32.0 - 36.0 g/dL   RDW 12.8 11.5 - 14.5 %  Platelets 213 150 - 440 K/uL  Urine Drug Screen, Qualitative     Status: Abnormal   Collection Time: 01/06/16  2:54 PM  Result Value Ref Range   Tricyclic, Ur Screen NONE DETECTED NONE DETECTED   Amphetamines, Ur Screen NONE DETECTED NONE DETECTED   MDMA (Ecstasy)Ur Screen NONE DETECTED NONE DETECTED   Cocaine Metabolite,Ur [place] POSITIVE (A) NONE DETECTED   Opiate, Ur Screen NONE DETECTED NONE DETECTED   Phencyclidine (PCP) Ur S NONE DETECTED NONE DETECTED   Cannabinoid 50 Ng, Ur  NONE DETECTED NONE DETECTED   Barbiturates, Ur Screen NONE DETECTED NONE DETECTED   Benzodiazepine, Ur Scrn NONE DETECTED NONE DETECTED   Methadone Scn, Ur NONE DETECTED NONE DETECTED    Comment: (NOTE) 837  Tricyclics, urine               Cutoff 1000 ng/mL 200  Amphetamines, urine             Cutoff 1000 ng/mL 300  MDMA (Ecstasy), urine           Cutoff 500 ng/mL 400  Cocaine Metabolite, urine       Cutoff 300 ng/mL 500  Opiate, urine                   Cutoff 300 ng/mL 600  Phencyclidine (PCP), urine      Cutoff 25 ng/mL 700  Cannabinoid, urine              Cutoff 50 ng/mL 800  Barbiturates, urine             Cutoff 200 ng/mL 900  Benzodiazepine, urine           Cutoff 200 ng/mL 1000 Methadone, urine                Cutoff 300 ng/mL 1100 1200 The urine drug screen provides only a preliminary, unconfirmed 1300 analytical test result and should not be used for non-medical 1400 purposes. Clinical consideration and professional judgment should 1500 be applied to any positive drug screen result  due to possible 1600 interfering substances. A more specific alternate chemical method 1700 must be used in order to obtain a confirmed analytical result.  1800 Gas chromato graphy / mass spectrometry (GC/MS) is the preferred 1900 confirmatory method.   ABO/Rh     Status: None   Collection Time: 01/06/16  2:54 PM  Result Value Ref Range   ABO/RH(D) O POS   hCG, quantitative, pregnancy     Status: Abnormal   Collection Time: 01/06/16  2:54 PM  Result Value Ref Range   hCG, Beta Chain, Quant, S 17 (H) <5 mIU/mL    Comment:          GEST. AGE      CONC.  (mIU/mL)   <=1 WEEK        5 - 50     2 WEEKS       50 - 500     3 WEEKS       100 - 10,000     4 WEEKS     1,000 - 30,000     5 WEEKS     3,500 - 115,000   6-8 WEEKS     12,000 - 270,000    12 WEEKS     15,000 - 220,000        FEMALE AND NON-PREGNANT FEMALE:     LESS THAN 5 mIU/mL   Pregnancy, urine POC  Status: Abnormal   Collection Time: 01/06/16  3:10 PM  Result Value Ref Range   Preg Test, Ur POSITIVE (A) NEGATIVE    Comment:        THE SENSITIVITY OF THIS METHODOLOGY IS >24 mIU/mL     No current facility-administered medications for this encounter.    No current outpatient prescriptions on file.    Musculoskeletal: Strength & Muscle Tone: within normal limits Gait & Station: normal Patient leans: N/A  Psychiatric Specialty Exam: Physical Exam  Nursing note and vitals reviewed. Constitutional: She appears well-developed and well-nourished.  HENT:  Head: Normocephalic and atraumatic.  Eyes: Conjunctivae are normal. Pupils are equal, round, and reactive to light.  Neck: Normal range of motion.  Cardiovascular: Regular rhythm and normal heart sounds.   Respiratory: Effort normal. No respiratory distress.  GI: Soft.  Musculoskeletal: Normal range of motion.  Neurological: She is alert.  Skin: Skin is warm and dry.  Psychiatric: Judgment normal. Her speech is delayed. She is slowed. Thought content is not  paranoid. Cognition and memory are normal. She exhibits a depressed mood. She expresses suicidal ideation. She expresses suicidal plans.    Review of Systems  Constitutional: Negative.   HENT: Negative.   Eyes: Negative.   Respiratory: Negative.   Cardiovascular: Negative.   Gastrointestinal: Negative.   Musculoskeletal: Negative.   Skin: Negative.   Neurological: Negative.   Psychiatric/Behavioral: Positive for depression, substance abuse and suicidal ideas. Negative for hallucinations and memory loss. The patient is nervous/anxious and has insomnia.     Blood pressure 134/87, pulse (!) 108, temperature 97.9 F (36.6 C), temperature source Oral, resp. rate 18, height 5' (1.524 m), weight 52.2 kg (115 lb), last menstrual period 01/02/2016, SpO2 100 %.Body mass index is 22.46 kg/m.  General Appearance: Guarded  Eye Contact:  Minimal  Speech:  Slow  Volume:  Decreased  Mood:  Depressed and Dysphoric  Affect:  Depressed  Thought Process:  Goal Directed  Orientation:  Full (Time, Place, and Person)  Thought Content:  Logical  Suicidal Thoughts:  Yes.  with intent/plan  Homicidal Thoughts:  No  Memory:  Immediate;   Good Recent;   Fair Remote;   Fair  Judgement:  Fair  Insight:  Fair  Psychomotor Activity:  Decreased  Concentration:  Concentration: Fair  Recall:  [employer] of Knowledge:  Fair  Language:  Fair  Akathisia:  No  Handed:  Right  AIMS (if indicated):     Assets:  Communication Skills Desire for Improvement Housing Physical Health Resilience  ADL's:  Intact  Cognition:  WNL  Sleep:        Treatment Plan Summary: Daily contact with patient to assess and evaluate symptoms and progress in treatment and Plan 25 year old woman with acute depressive symptoms. Given the circumstances I will give her a diagnosis of adjustment disorder although this may turn out to be a major depression. Because of the new discovery of the pregnancy test being positive I'm  withholding starting antidepressants. We will start prenatal vitamins. Patient will be admitted to the psychiatric ward. Further evaluation and treatment in a safe environment. She agrees to the plan. Reviewed with the ER doctor.  Disposition: Recommend psychiatric Inpatient admission when medically cleared. Supportive therapy provided about ongoing stressors.  Alethia Berthold, MD 01/06/2016 5:12 PM

## 2016-01-06 NOTE — ED Notes (Signed)
Pt pleasant and copperative. Pt noted to be depressed. Pt denies SI/HI. Pt states I just want to sleep. I havent been sleeping. Pt states loss of appetite. Pt denies any pain. Will continue to monitor. No distress noted.

## 2016-01-07 DIAGNOSIS — F4321 Adjustment disorder with depressed mood: Principal | ICD-10-CM

## 2016-01-07 DIAGNOSIS — F17200 Nicotine dependence, unspecified, uncomplicated: Secondary | ICD-10-CM

## 2016-01-07 DIAGNOSIS — F410 Panic disorder [episodic paroxysmal anxiety] without agoraphobia: Secondary | ICD-10-CM

## 2016-01-07 MED ORDER — CLONAZEPAM 0.5 MG PO TABS: 0.2500 mg | ORAL_TABLET | Freq: Two times a day (BID) | ORAL | Status: DC | PRN

## 2016-01-07 MED ORDER — ESCITALOPRAM OXALATE 10 MG PO TABS
5.0000 mg | ORAL_TABLET | Freq: Every day | ORAL | Status: DC
  Administered 2016-01-07 – 2016-01-08 (×2): 5 mg via ORAL
  Filled 2016-01-07 (×2): qty 1

## 2016-01-07 MED ORDER — OCUVITE-LUTEIN PO CAPS: 1.0000 | ORAL_CAPSULE | Freq: Every day | ORAL | 0 refills | Status: DC

## 2016-01-07 MED ORDER — HYDROXYZINE HCL 25 MG PO TABS
25.0000 mg | ORAL_TABLET | Freq: Three times a day (TID) | ORAL | Status: DC | PRN
  Administered 2016-01-07: 25 mg via ORAL
  Filled 2016-01-07: qty 1

## 2016-01-07 MED ORDER — ESCITALOPRAM OXALATE 5 MG PO TABS: 5.0000 mg | ORAL_TABLET | Freq: Every day | ORAL | 0 refills | Status: DC

## 2016-01-07 MED ORDER — HYDROXYZINE HCL 25 MG PO TABS: 25.0000 mg | ORAL_TABLET | Freq: Three times a day (TID) | ORAL | 0 refills | Status: DC | PRN

## 2016-01-07 MED ORDER — HYDROXYZINE HCL 25 MG PO TABS: 25.0000 mg | ORAL_TABLET | Freq: Three times a day (TID) | ORAL | Status: DC | PRN

## 2016-01-07 NOTE — BHH Counselor (Signed)
Adult Comprehensive Assessment  Patient ID: KWYNN MCCULLICK, female   DOB: [birth year], 25 y.o.   MRN: [id number]  Information Source: Information source: Patient  Current Stressors:  Educational / Learning stressors: n/a Employment / Job issues: n/a Family Relationships: Pt reports that none of her family want to talk with her.  Financial / Lack of resources (include bankruptcy): n/a Housing / Lack of housing: n/a Physical health (include injuries & life threatening diseases): n/a Social relationships: n/a Substance abuse: Patient denies Bereavement / Loss: n/a  Living/Environment/Situation:  Living Arrangements: Spouse/significant other Living conditions (as described by patient or guardian): Pt states "It's fine" How long has patient lived in current situation?: 2 years. What is atmosphere in current home: Supportive, Comfortable  Family History:  Marital status: Long term relationship Long term relationship, how long?: 9 years What types of issues is patient dealing with in the relationship?: Pt states "I cheated on my husband".  Additional relationship information: n/a Are you sexually active?: Yes What is your sexual orientation?: heterosexual Has your sexual activity been affected by drugs, alcohol, medication, or emotional stress?: n/a Does patient have children?: Yes How many children?: 1 How is patient's relationship with their children?: daughter. Pt states her relationship with her daughter is wonderful.   Childhood History:  By whom was/is the patient raised?: Both parents Description of patient's relationship with caregiver when they were a child: Pt states "It was good" Patient's description of current relationship with people who raised him/her: Pt states before this incident is was fine but she is unsure what how to describe the relationship" How were you disciplined when you got in trouble as a child/adolescent?: n/a Does patient have siblings?: Yes Number  of Siblings: 3 Description of patient's current relationship with siblings: Pt reports her relationship with siblings is "good" and reports speaking to them everyday.  Did patient suffer any verbal/emotional/physical/sexual abuse as a child?: No Did patient suffer from severe childhood neglect?: No Has patient ever been sexually abused/assaulted/raped as an adolescent or adult?: No Was the patient ever a victim of a crime or a disaster?: No Witnessed domestic violence?: No Has patient been effected by domestic violence as an adult?: No  Education:  Highest grade of school patient has completed: High school diploma Currently a student?: No Name of school: n/a Learning disability?: No  Employment/Work Situation:   Employment situation: Unemployed Patient's job has been impacted by current illness: No 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  Has patient ever been in the [employer]?: No Has patient ever served in combat?: No Did You Receive Any Psychiatric Treatment/Services While in the [employer]?: No Are There Guns or Other Weapons in [place]?: No Are These Weapons Safely Secured?:  (n/a)  Financial Resources:   Financial resources: Income from spouse, No income Does patient have a representative payee or guardian?: No  Alcohol/Substance Abuse:   What has been your use of drugs/alcohol within the last 12 months?: THC, patient reports she does not smoke often  If attempted suicide, did drugs/alcohol play a role in this?: No Alcohol/Substance Abuse Treatment Hx: Denies past history Has alcohol/substance abuse ever caused legal problems?: No  Social Support System:   Patient's Community Support System: Fair Describe Community Support System: Pt reports support from her ex-boyfriend.  Type of faith/religion: n/a How does patient's faith help to cope with current illness?: n/a  Leisure/Recreation:    Leisure  and Hobbies: drawing  Strengths/Needs:   What things does the patient do well?: Pt states she is a good mother In what areas does patient struggle / problems for patient: communication, anxiety attacks, feeling overwhelmed  Discharge Plan:   Does patient have access to transportation?: Yes (Pt will have transportation from ex-boyfriend) Will patient be returning to same living situation after discharge?: Yes Currently receiving community mental health services: No If no, would patient like referral for services when discharged?:  ([place], [place]) Does patient have financial barriers related to discharge medications?: Yes Patient description of barriers related to discharge medications: Medication management clinic.   Summary/Recommendations:   Patient is a 25 year old female admitted voluntarily with a diagnosis of adjustment disorder. Information was obtain from patient assessment and chart review conducted by evaluator. Patient presented to the hospital with suicidal thoughts and depression. Patient reports primary triggers for admission were experiencing anxiety, and depression for the last two weeks in which she started having suicidal thoughts. Patient recently cheated on her fiance' and states she has little support outside of her ex-fiance'. Patient is agreeing to follow-up with [facility] for outpatient therapy and medication management. Patient has no insurance and will be referred to medication management clinic for help with affording medications. Patient will benefit from crisis stabilization, medication evaluation, group therapy and psycho education in addition to case management for discharge. At discharge, it is recommended that patient remain compliant with established discharge plan and continued treatment.    Amiel Mccaffrey G. [place], [facility] 01/07/2016 11:53 AM

## 2016-01-07 NOTE — BHH Group Notes (Signed)
[facility] Group Notes:  (Nursing/MHT/Case Management/Adjunct)  Date:  01/07/2016  Time:  9:53 PM  Type of Therapy:  Group Therapy  Participation Level:  Did Not Attend  Participation Quality:  Summary of Progress/Problems:  Toni Blake 01/07/2016, 9:53 PM

## 2016-01-07 NOTE — BHH Group Notes (Signed)
[facility] Group Notes:  (Nursing/MHT/Case Management/Adjunct)  Date:  01/07/2016  Time:  5:19 PM  Type of Therapy:  Psychoeducational Skills  Participation Level:  Did Not Attend   [name] 01/07/2016, 5:19 PM

## 2016-01-07 NOTE — Plan of Care (Signed)
Problem: Coping: Goal: Ability to verbalize feelings will improve Outcome: Progressing verbalizes that she feels better today and has to concentrate on what her next step is.

## 2016-01-07 NOTE — Tx Team (Signed)
Initial Treatment Plan 01/07/2016 1:57 AM Ronnie Doss [id number]    PATIENT STRESSORS: Marital or family conflict Substance abuse infidelity   PATIENT STRENGTHS: Communication skills Motivation for treatment/growth Special hobby/interest   PATIENT IDENTIFIED PROBLEMS: Depression    Substance Abuse.     Suicidal ideation             DISCHARGE CRITERIA:  Ability to meet basic life and health needs Improved stabilization in mood, thinking, and/or behavior Motivation to continue treatment in a less acute level of care  PRELIMINARY DISCHARGE PLAN: Outpatient therapy Participate in family therapy  PATIENT/FAMILY INVOLVEMENT: This treatment plan has been presented to and reviewed with the patient, CHERIA WHOBREY,  The patient and family have been given the opportunity to ask questions and make suggestions.  Harl Bowie, RN 01/07/2016, 1:57 AM

## 2016-01-07 NOTE — Plan of Care (Signed)
Problem: Coping: Goal: Ability to verbalize feelings will improve Outcome: Progressing Patient verbalized feelings to writer.    

## 2016-01-07 NOTE — H&P (Addendum)
Psychiatric Admission Assessment Adult  Patient Identification: Toni Blake MRN:  [id number] Date of Evaluation:  01/07/2016 Chief Complaint:  Adjustment disorder Principal Diagnosis: Adjustment disorder with depressed mood Diagnosis:   Patient Active Problem List   Diagnosis Date Noted  . Panic disorder [F41.0] 01/07/2016  . Tobacco use disorder [F17.200] 01/07/2016  . Adjustment disorder with depressed mood [F43.21] 01/06/2016  . Suicidal ideation [R45.851] 01/06/2016  . Migraine [G43.909] 01/06/2016  . Cocaine abuse [F14.10] 01/06/2016  . Pregnancy <1 week [Z33.1] 01/06/2016   History of Present Illness:   The patient is a 25 year old single Caucasian female from [place] who came in voluntarily to our emergency department on August 25 reporting depression and suicidal ideation.  Patient stated that she  cheated on her boyfriend of 9 years and her boyfriend just found out a few days ago. He was made aware by the patient's brother. Patient states that her siblings are now angry at her and one talk to her.  She started having suicidal thoughts on Friday morning (while in the shower she was thinking about cutting herself with a razor blade).  Patient has never been hospitalized psychiatrically in the past and has never had any suicidal attempts or self injury.  While in the emergency department the patient was found to have a pregnancy of less than 1 week. Patient has a 62-year-old daughter with her boyfriend.  She doesn't know if she wants to continue with the pregnancy.   She says that over the last few days she has been having panic attacks. She describes the episodes as severe heart palpitations, shortness of breath and severe anxiety  that can last from 10 minutes to an hour. She said that she has had been in the past as well.  Usually they occur when the patient is overwhelmed.  Today she denies having suicidal ideation, homicidal ideation, auditory or visual  hallucinations, helplessness or helplessness. She plans to try to work things out with her boyfriend as they have been together for a long time and have a child together. Patient feels ready for discharge and is requesting to be sent home as soon as possible.  Trauma she denies any history of trauma  Substance abuse the patient stated that she smokes marijuana once in a blue moon and smokes cigarettes about 1 pack per day. She denies the use of alcohol or any other illicit substances.  Associated Signs/Symptoms: Depression Symptoms:  depressed mood, suicidal thoughts without plan, anxiety, (Hypo) Manic Symptoms:  Impulsivity, Anxiety Symptoms:  Panic Symptoms, Psychotic Symptoms:  Denies PTSD Symptoms: Negative Total Time spent with patient: 1 hour  Past Psychiatric History: Patient received psychiatric treatment when she was 25 years old for anxiety and depression. Stated that she was started on medications at that time. She does not recall the name of the medications. She has never been hospitalized and denies any history of suicidal attempts  Is the patient at risk to self? Yes.    Has the patient been a risk to self in the past 6 months? No.  Has the patient been a risk to self within the distant past? No.  Is the patient a risk to others? No.  Has the patient been a risk to others in the past 6 months? No.  Has the patient been a risk to others within the distant past? No.    Alcohol Screening: 1. How often do you have a drink containing alcohol?: Monthly or less 2. How many drinks containing  alcohol do you have on a typical day when you are drinking?: 7, 8, or 9 3. How often do you have six or more drinks on one occasion?: Monthly Preliminary Score: 5 4. How often during the last year have you found that you were not able to stop drinking once you had started?: Never 5. How often during the last year have you failed to do what was normally expected from you becasue of drinking?:  Never 6. How often during the last year have you needed a first drink in the morning to get yourself going after a heavy drinking session?: Never 7. How often during the last year have you had a feeling of guilt of remorse after drinking?: Never 8. How often during the last year have you been unable to remember what happened the night before because you had been drinking?: Never 9. Have you or someone else been injured as a result of your drinking?: No 10. Has a relative or friend or a doctor or another health worker been concerned about your drinking or suggested you cut down?: No Alcohol Use Disorder Identification Test Final Score (AUDIT): 6 Brief Intervention: AUDIT score less than 7 or less-screening does not suggest unhealthy drinking-brief intervention not indicated  Past Medical History:  Past Medical History:  Diagnosis Date  . Anxiety   . Depression   . Migraine    History reviewed. No pertinent surgical history.  Family History: History reviewed. No pertinent family history.  Family Psychiatric  History: Patient denies any history of suicide in her family. Her mother and sister both suffer from depression and takes medication for it  Tobacco Screening: Have you used any form of tobacco in the last 30 days? (Cigarettes, Smokeless Tobacco, Cigars, and/or Pipes): Yes Tobacco use, Select all that apply: 5 or more cigarettes per day Are you interested in Tobacco Cessation Medications?: Yes, will notify MD for an order Counseled patient on smoking cessation including recognizing danger situations, developing coping skills and basic information about quitting provided: Yes  Social History: Patient lives with her boyfriend of 9 years they have a 23-year-old daughter together. 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 She is never married doesn't have any other children. She denies any financial stressors at this time. She has a 12th grade education. She has worked in the past and for lying for 2  years and at the [employer] planned for 6 months. She has supportive siblings but they are currently angry at her for the infidelity. She is not close to her parents History  Alcohol Use No     History  Drug Use  . Types: Marijuana    Additional Social History: Marital status: Long term relationship Long term relationship, how long?: 9 years What types of issues is patient dealing with in the relationship?: Pt states "I cheated on my husband".  Additional relationship information: n/a Are you sexually active?: Yes What is your sexual orientation?: heterosexual Has your sexual activity been affected by drugs, alcohol, medication, or emotional stress?: n/a Does patient have children?: Yes How many children?: 1 How is patient's relationship with their children?: daughter. Pt states her relationship with her daughter is wonderful.        Allergies:  No Known Allergies   Lab Results:  Results for orders placed or performed during the hospital encounter of 01/06/16 (from the past 48 hour(s))  Comprehensive metabolic panel     Status: Abnormal   Collection Time: 01/06/16  2:54 PM  Result Value Ref Range   Sodium 140 135 - 145 mmol/L   Potassium 3.0 (L) 3.5 - 5.1 mmol/L   Chloride 105 101 - 111 mmol/L   CO2 27 22 - 32 mmol/L   Glucose, Bld 96 65 - 99 mg/dL   BUN 11 6 - 20 mg/dL   Creatinine, Ser 0.85 0.44 - 1.00 mg/dL   Calcium 9.3 8.9 - 10.3 mg/dL   Total Protein 8.2 (H) 6.5 - 8.1 g/dL   Albumin 4.8 3.5 - 5.0 g/dL   AST 23 15 - 41 U/L   ALT 13 (L) 14 - 54 U/L   Alkaline Phosphatase 90 38 - 126 U/L   Total Bilirubin 1.4 (H) 0.3 - 1.2 mg/dL   GFR calc non Af Amer >60 >60 mL/min   GFR calc Af Amer >60 >60 mL/min    Comment: (NOTE) The eGFR has been calculated using the CKD EPI equation. This calculation has not been validated in all clinical situations. eGFR's persistently <60 mL/min signify possible Chronic Kidney Disease.    Anion gap 8 5 - 15  Ethanol      Status: None   Collection Time: 01/06/16  2:54 PM  Result Value Ref Range   Alcohol, Ethyl (B) <5 <5 mg/dL    Comment:        LOWEST DETECTABLE LIMIT FOR SERUM ALCOHOL IS 5 mg/dL FOR MEDICAL PURPOSES ONLY   Salicylate level     Status: None   Collection Time: 01/06/16  2:54 PM  Result Value Ref Range   Salicylate Lvl 4.5 2.8 - 30.0 mg/dL  Acetaminophen level     Status: Abnormal   Collection Time: 01/06/16  2:54 PM  Result Value Ref Range   Acetaminophen (Tylenol), Serum <10 (L) 10 - 30 ug/mL    Comment:        THERAPEUTIC CONCENTRATIONS VARY SIGNIFICANTLY. A RANGE OF 10-30 ug/mL MAY BE AN EFFECTIVE CONCENTRATION FOR MANY PATIENTS. HOWEVER, SOME ARE BEST TREATED AT CONCENTRATIONS OUTSIDE THIS RANGE. ACETAMINOPHEN CONCENTRATIONS >150 ug/mL AT 4 HOURS AFTER INGESTION AND >50 ug/mL AT 12 HOURS AFTER INGESTION ARE OFTEN ASSOCIATED WITH TOXIC REACTIONS.   cbc     Status: None   Collection Time: 01/06/16  2:54 PM  Result Value Ref Range   WBC 9.4 3.6 - 11.0 K/uL   RBC 5.14 3.80 - 5.20 MIL/uL   Hemoglobin 15.4 12.0 - 16.0 g/dL   HCT 44.4 35.0 - 47.0 %   MCV 86.4 80.0 - 100.0 fL   MCH 30.1 26.0 - 34.0 pg   MCHC 34.8 32.0 - 36.0 g/dL   RDW 12.8 11.5 - 14.5 %   Platelets 213 150 - 440 K/uL  Urine Drug Screen, Qualitative     Status: Abnormal   Collection Time: 01/06/16  2:54 PM  Result Value Ref Range   Tricyclic, Ur Screen NONE DETECTED NONE DETECTED   Amphetamines, Ur Screen NONE DETECTED NONE DETECTED   MDMA (Ecstasy)Ur Screen NONE DETECTED NONE DETECTED   Cocaine Metabolite,Ur [place] POSITIVE (A) NONE DETECTED   Opiate, Ur Screen NONE DETECTED NONE DETECTED   Phencyclidine (PCP) Ur S NONE DETECTED NONE DETECTED   Cannabinoid 50 Ng, Ur [place] NONE DETECTED NONE DETECTED   Barbiturates, Ur Screen NONE DETECTED NONE DETECTED   Benzodiazepine, Ur Scrn NONE DETECTED NONE DETECTED   Methadone Scn, Ur NONE DETECTED NONE DETECTED    Comment: (NOTE) 549  Tricyclics, urine  Cutoff 1000 ng/mL 200  Amphetamines, urine             Cutoff 1000 ng/mL 300  MDMA (Ecstasy), urine           Cutoff 500 ng/mL 400  Cocaine Metabolite, urine       Cutoff 300 ng/mL 500  Opiate, urine                   Cutoff 300 ng/mL 600  Phencyclidine (PCP), urine      Cutoff 25 ng/mL 700  Cannabinoid, urine              Cutoff 50 ng/mL 800  Barbiturates, urine             Cutoff 200 ng/mL 900  Benzodiazepine, urine           Cutoff 200 ng/mL 1000 Methadone, urine                Cutoff 300 ng/mL 1100 1200 The urine drug screen provides only a preliminary, unconfirmed 1300 analytical test result and should not be used for non-medical 1400 purposes. Clinical consideration and professional judgment should 1500 be applied to any positive drug screen result due to possible 1600 interfering substances. A more specific alternate chemical method 1700 must be used in order to obtain a confirmed analytical result.  1800 Gas chromato graphy / mass spectrometry (GC/MS) is the preferred 1900 confirmatory method.   ABO/Rh     Status: None   Collection Time: 01/06/16  2:54 PM  Result Value Ref Range   ABO/RH(D) O POS   hCG, quantitative, pregnancy     Status: Abnormal   Collection Time: 01/06/16  2:54 PM  Result Value Ref Range   hCG, Beta Chain, Quant, S 17 (H) <5 mIU/mL    Comment:          GEST. AGE      CONC.  (mIU/mL)   <=1 WEEK        5 - 50     2 WEEKS       50 - 500     3 WEEKS       100 - 10,000     4 WEEKS     1,000 - 30,000     5 WEEKS     3,500 - 115,000   6-8 WEEKS     12,000 - 270,000    12 WEEKS     15,000 - 220,000        FEMALE AND NON-PREGNANT FEMALE:     LESS THAN 5 mIU/mL   Pregnancy, urine POC     Status: Abnormal   Collection Time: 01/06/16  3:10 PM  Result Value Ref Range   Preg Test, Ur POSITIVE (A) NEGATIVE    Comment:        THE SENSITIVITY OF THIS METHODOLOGY IS >24 mIU/mL     Blood Alcohol level:  Lab Results  Component Value Date   ETH <5  43/32/9518    Metabolic Disorder Labs:  No results found for: HGBA1C, MPG No results found for: PROLACTIN No results found for: CHOL, TRIG, HDL, CHOLHDL, VLDL, LDLCALC  Current Medications: Current Facility-Administered Medications  Medication Dose Route Frequency Provider Last Rate Last Dose  . acetaminophen (TYLENOL) tablet 650 mg  650 mg Oral Q6H PRN Gonzella Lex, MD      . alum & mag hydroxide-simeth (MAALOX/MYLANTA) 200-200-20 MG/5ML suspension 30 mL  30 mL Oral Q4H PRN Jenny Reichmann T  Clapacs, MD      . diphenhydrAMINE (BENADRYL) capsule 50 mg  50 mg Oral QHS PRN Gonzella Lex, MD   50 mg at 01/07/16 0009  . escitalopram (LEXAPRO) tablet 5 mg  5 mg Oral Daily Hildred Priest, MD      . hydrOXYzine (ATARAX/VISTARIL) tablet 25 mg  25 mg Oral TID PRN Hildred Priest, MD      . magnesium hydroxide (MILK OF MAGNESIA) suspension 30 mL  30 mL Oral Daily PRN Gonzella Lex, MD      . multivitamin-lutein (OCUVITE-LUTEIN) capsule 1 capsule  1 capsule Oral Daily Gonzella Lex, MD   1 capsule at 01/07/16 2229   PTA Medications: No prescriptions prior to admission.    Musculoskeletal: Strength & Muscle Tone: within normal limits Gait & Station: normal Patient leans: N/A  Psychiatric Specialty Exam: Physical Exam  Constitutional: She is oriented to person, place, and time. She appears well-developed and well-nourished.  HENT:  Head: Normocephalic and atraumatic.  Eyes: EOM are normal.  Neck: Normal range of motion.  Respiratory: Effort normal.  Neurological: She is alert and oriented to person, place, and time.    Review of Systems  Constitutional: Negative.   HENT: Negative.   Eyes: Negative.   Cardiovascular: Negative.   Skin: Negative.     Blood pressure (!) 101/51, pulse (!) 48, temperature 98.2 F (36.8 C), resp. rate 18, height 5' (1.524 m), weight 49.9 kg (110 lb), last menstrual period 01/02/2016, SpO2 100 %.Body mass index is 21.48 kg/m.  General  Appearance: Fairly Groomed  Eye Contact:  Good  Speech:  Clear and Coherent  Volume:  Normal  Mood:  Dysphoric  Affect:  Congruent  Thought Process:  Linear and Descriptions of Associations: Intact  Orientation:  Full (Time, Place, and Person)  Thought Content:  Hallucinations: None  Suicidal Thoughts:  No  Homicidal Thoughts:  No  Memory:  Immediate;   Good Recent;   Good Remote;   Good  Judgement:  Fair  Insight:  Fair  Psychomotor Activity:  Normal  Concentration:  Concentration: Good and Attention Span: Good  Recall:  Good  Fund of Knowledge:  Good  Language:  Good  Akathisia:  No  Handed:    AIMS (if indicated):     Assets:  Communication Skills Physical Health  ADL's:  Intact  Cognition:  WNL  Sleep:  Number of Hours: 4.15       Treatment Plan Summary: Daily contact with patient to assess and evaluate symptoms and progress in treatment and Medication management  Adjudgment disorder with depressed mood patient is no longer voicing thoughts of suicide. She denies symptoms of major depressive disorder prior to Friday.  Possible panic disorder patient reports history of panic attacks in the past and recently has been having them daily. She is in agreement with starting a low-dose of an antidepressant. She understands the risk of these medications during pregnancy says that she doesn't know if she is going to continue with the pregnancy.  Patient will be started on Lexapro 5 mg by mouth daily  Insomnia patient will be continued on Vistaril as needed for anxiety and for insomnia  Tobacco use disorder patient declines from using nicotine patch  Cocaine use disorder and cannabis use disorder: urine toxicology was positive for patient denies use of cocaine. Patient will benefit from referral to substance abuse treatment  Pregnancy : On prenatal vitamins  Precautions patient was on one-to-one precautions this will be discontinued today as  she has not displayed any unsafe  or disruptive behaviors. She has been calm and cooperative and is no longer voicing suicidality  Precautions every 15 minute checks  Diet regular  Disposition she will be returning to her home once stable  Follow-up she will be scheduled to follow-up with psychiatry in the community.   I certify that inpatient services furnished can reasonably be expected to improve the patient's condition.    Hildred Priest, MD 8/26/201712:46 PM

## 2016-01-07 NOTE — BHH Group Notes (Signed)
[place] LCSW Group Therapy  01/07/2016 3:08 PM  Type of Therapy:  Group Therapy  Participation Level:  Minimal  Participation Quality:  Attentive  Affect:  Appropriate  Cognitive:  Alert  Insight:  Limited  Engagement in Therapy:  Limited  Modes of Intervention:  Activity, Discussion, Education and Support  Summary of Progress/Problems:Communications: Patients identify how individuals communicate with one another appropriately and inappropriately. Patients will be guided to discuss their thoughts, feelings, and behaviors related to barriers when communicating. The group will process together ways to execute positive and appropriate communications. Pt would share in the group discussion when prompted by the CSW. Pt stated she needs to improve her communication by being willing to engage in conversations with other. Pt states "I usually keep everything in until I explode". CSW provided support to pt and discussed effective communication skills.   Raiden Yearwood G. [place], [place] 01/07/2016, 3:10 PM

## 2016-01-07 NOTE — BHH Suicide Risk Assessment (Addendum)
[facility] Admission Suicide Risk Assessment   Nursing information obtained from:  Patient Demographic factors:  Adolescent or young adult, Low socioeconomic status, Caucasian, Unemployed Current Mental Status:  NA Loss Factors:  Loss of significant relationship Historical Factors:  Family history of mental illness or substance abuse Risk Reduction Factors:  Pregnancy, Responsible for children under 23 years of age, Positive social support, Positive therapeutic relationship  Total Time spent with patient: 1 hour Principal Problem: Adjustment disorder with depressed mood Diagnosis:   Patient Active Problem List   Diagnosis Date Noted  . Panic disorder [F41.0] 01/07/2016  . Tobacco use disorder [F17.200] 01/07/2016  . Adjustment disorder with depressed mood [F43.21] 01/06/2016  . Suicidal ideation [R45.851] 01/06/2016  . Migraine [G43.909] 01/06/2016  . Cocaine abuse [F14.10] 01/06/2016  . Pregnancy <1 week [Z33.1] 01/06/2016   Subjective Data:   Continued Clinical Symptoms:  Alcohol Use Disorder Identification Test Final Score (AUDIT): 6 The "Alcohol Use Disorders Identification Test", Guidelines for Use in Primary Care, Second Edition.  [facility]). Score between 0-7:  no or low risk or alcohol related problems. Score between 8-15:  moderate risk of alcohol related problems. Score between 16-19:  high risk of alcohol related problems. Score 20 or above:  warrants further diagnostic evaluation for alcohol dependence and treatment.   CLINICAL FACTORS:   Panic Attacks Alcohol/Substance Abuse/Dependencies Previous Psychiatric Diagnoses and Treatments    Psychiatric Specialty Exam: Physical Exam  ROS  Blood pressure (!) 95/58, pulse 73, temperature 98 F (36.7 C), temperature source Oral, resp. rate 18, height 5' (1.524 m), weight 49.9 kg (110 lb), last menstrual period 01/02/2016, SpO2 100 %.Body mass index is 21.48 kg/m.                                                     Sleep:  Number of Hours: 5.15      COGNITIVE FEATURES THAT CONTRIBUTE TO RISK:  None    SUICIDE RISK:   Mild:  Suicidal ideation of limited frequency, intensity, duration, and specificity.  There are no identifiable plans, no associated intent, mild dysphoria and related symptoms, good self-control (both objective and subjective assessment), few other risk factors, and identifiable protective factors, including available and accessible social support.   PLAN OF CARE: admit to [facility]  I certify that inpatient services furnished can reasonably be expected to improve the patient's condition.  Hildred Priest, MD 01/08/2016, 8:44 AM

## 2016-01-07 NOTE — Progress Notes (Signed)
D:  Patient verbalizes that she feels much better.  States that she just needs to figure out what her next step is.  Denies SI/HI/AVH.  Affect sad.  Visible in the milieu.  Interacting appropriately with peers and staff.  A:  Support and encouragement offered.  Allowed patient to verbalize feelings.  Q 15 minute rounding done.  Safety maintained.  Educated on [employer] and information sheet given.  Scheduled medications administered.   R:  Receptive to treatment plan.  Safety maintained.

## 2016-01-07 NOTE — Progress Notes (Signed)
Admission Note:  25 yr female who presents IVC in distress for the treatment of SI and Depression. Pt appears flat and depressed. Pt was tearful and anxious but  cooperative with admission process. Pt denies SI/HI/AVH and contracts for safety upon admission. Pt experienced SI because she cheated on her boyfriend. Patient's  family and the boyfriend are upset at her, and this made her feel worthless, ashamed, and suicidal. Pt has Past medical Hx of Depression, Migraine and anxiety. Skin was assessed and found to be clear of any abnormal marks. PT searched and no contraband found, POC and unit policies explained and understanding verbalized. Consents obtained. Food and fluids offered, and both accepted. Pt had no additional questions or concerns.will continue to monitor.

## 2016-01-07 NOTE — BHH Suicide Risk Assessment (Signed)
[facility] INPATIENT:  Family/Significant Other Suicide Prevention Education  Suicide Prevention Education:  Education Completed; Toni Blake (ex-boyfriend/fiance'), has been identified by the patient as the family member/significant other with whom the patient will be residing, and identified as the person(s) who will aid the patient in the event of a mental health crisis (suicidal ideations/suicide attempt).  With written consent from the patient, the family member/significant other has been provided the following suicide prevention education, prior to the and/or following the discharge of the patient. Patient will be returning home to live with her ex-fiance'. CSW contacted identified support to discuss suicide prevention education. Identified support was also made aware of patients follow-up care. Identified support, Toni Blake, stated that he will ensure that patient maintains her follow-up appointments.   The suicide prevention education provided includes the following:  Suicide risk factors  Suicide prevention and interventions  National Suicide Hotline telephone number  [facility] assessment telephone number  [facility] Emergency Assistance [place] and/or Residential Mobile Crisis Unit telephone number  Request made of family/significant other to:  Remove weapons (e.g., guns, rifles, knives), all items previously/currently identified as safety concern.    Remove drugs/medications (over-the-counter, prescriptions, illicit drugs), all items previously/currently identified as a safety concern.   The family member/significant other verbalizes understanding of the suicide prevention education information provided.  The family member/significant other agrees to remove the items of safety concern listed above.  Kylian Loh G. [place], [place] 01/07/2016, 3:30 PM

## 2016-01-07 NOTE — [facility] Suicide Risk Assessment (Addendum)
[facility] Discharge Suicide Risk Assessment   Principal Problem: Adjustment disorder with depressed mood Discharge Diagnoses:  Patient Active Problem List   Diagnosis Date Noted  . Panic disorder [F41.0] 01/07/2016  . Tobacco use disorder [F17.200] 01/07/2016  . Adjustment disorder with depressed mood [F43.21] 01/06/2016  . Suicidal ideation [R45.851] 01/06/2016  . Migraine [G43.909] 01/06/2016  . Cocaine abuse [F14.10] 01/06/2016  . Pregnancy <1 week [Z33.1] 01/06/2016     Psychiatric Specialty Exam: ROS  Blood pressure (!) 101/51, pulse (!) 48, temperature 98.2 F (36.8 C), resp. rate 18, height 5' (1.524 m), weight 49.9 kg (110 lb), last menstrual period 01/02/2016, SpO2 100 %.Body mass index is 21.48 kg/m.                                                    ADL's:  Intact   Mental Status Per Nursing Assessment::   On Admission:  NA  Demographic Factors:  Adolescent or young adult and Caucasian  Loss Factors: Loss of significant relationship  Historical Factors: Family history of mental illness or substance abuse and Impulsivity  Risk Reduction Factors:   Responsible for children under 82 years of age  Continued Clinical Symptoms:  Alcohol/Substance Abuse/Dependencies Previous Psychiatric Diagnoses and Treatments  Cognitive Features That Contribute To Risk:  None    Suicide Risk:  Minimal: No identifiable suicidal ideation.  Patients presenting with no risk factors but with morbid ruminations; may be classified as minimal risk based on the severity of the depressive symptoms   Hildred Priest, MD 01/07/2016, 2:07 PM

## 2016-01-07 NOTE — Discharge Summary (Signed)
Physician Discharge Summary Note  Patient:  Toni Blake is an 25 y.o., female MRN:  [id number] DOB:  [birth year] Patient phone:  [phone] (home)  Patient address:   [address] [place] [address],  Total Time spent with patient: 30 minutes  Date of Admission:  01/06/2016 Date of Discharge: 01/08/16  Reason for Admission:  SI  Principal Problem: Adjustment disorder with depressed mood Discharge Diagnoses: Patient Active Problem List   Diagnosis Date Noted  . Panic disorder [F41.0] 01/07/2016  . Tobacco use disorder [F17.200] 01/07/2016  . Adjustment disorder with depressed mood [F43.21] 01/06/2016  . Suicidal ideation [R45.851] 01/06/2016  . Migraine [G43.909] 01/06/2016  . Cocaine abuse [F14.10] 01/06/2016  . Pregnancy <1 week [Z33.1] 01/06/2016    History of Present Illness:   The patient is a 25 year old single Caucasian female from [place] who came in voluntarily to our emergency department on August 25 reporting depression and suicidal ideation.  Patient stated that she  cheated on her boyfriend of 9 years and her boyfriend just found out a few days ago. He was made aware by the patient's brother. Patient states that her siblings are now angry at her and one talk to her.  She started having suicidal thoughts on Friday morning (while in the shower she was thinking about cutting herself with a razor blade).  Patient has never been hospitalized psychiatrically in the past and has never had any suicidal attempts or self injury.  While in the emergency department the patient was found to have a pregnancy of less than 1 week. Patient has a 19-year-old daughter with her boyfriend.  She doesn't know if she wants to continue with the pregnancy.   She says that over the last few days she has been having panic attacks. She describes the episodes as severe heart palpitations, shortness of breath and severe anxiety  that can last from 10 minutes to an  hour. She said that she has had been in the past as well.  Usually they occur when the patient is overwhelmed.  Today she denies having suicidal ideation, homicidal ideation, auditory or visual hallucinations, helplessness or helplessness. She plans to try to work things out with her boyfriend as they have been together for a long time and have a child together. Patient feels ready for discharge and is requesting to be sent home as soon as possible.  Trauma she denies any history of trauma  Substance abuse the patient stated that she smokes marijuana once in a blue moon and smokes cigarettes about 1 pack per day. She denies the use of alcohol or any other illicit substances.  Associated Signs/Symptoms: Depression Symptoms:  depressed mood, suicidal thoughts without plan, anxiety, (Hypo) Manic Symptoms:  Impulsivity, Anxiety Symptoms:  Panic Symptoms, Psychotic Symptoms:  Denies PTSD Symptoms: Negative Total Time spent with patient: 1 hour  Past Psychiatric History: Patient received psychiatric treatment when she was 25 years old for anxiety and depression. Stated that she was started on medications at that time. She does not recall the name of the medications. She has never been hospitalized and denies any history of suicidal attempts  I  Alcohol Screening: 1. How often do you have a drink containing alcohol?: Monthly or less 2. How many drinks containing alcohol do you have on a typical day when you are drinking?: 7, 8, or 9 3. How often do you have six or more drinks on one occasion?: Monthly Preliminary Score: 5 4. How often during the last  year have you found that you were not able to stop drinking once you had started?: Never 5. How often during the last year have you failed to do what was normally expected from you becasue of drinking?: Never 6. How often during the last year have you needed a first drink in the morning to get yourself going after a heavy drinking session?:  Never 7. How often during the last year have you had a feeling of guilt of remorse after drinking?: Never 8. How often during the last year have you been unable to remember what happened the night before because you had been drinking?: Never 9. Have you or someone else been injured as a result of your drinking?: No 10. Has a relative or friend or a doctor or another health worker been concerned about your drinking or suggested you cut down?: No Alcohol Use Disorder Identification Test Final Score (AUDIT): 6 Brief Intervention: AUDIT score less than 7 or less-screening does not suggest unhealthy drinking-brief intervention not indicated  Past Medical History:      Past Medical History:  Diagnosis Date  . Anxiety   . Depression   . Migraine    History reviewed. No pertinent surgical history.  Family History: History reviewed. No pertinent family history.  Family Psychiatric  History: Patient denies any history of suicide in her family. Her mother and sister both suffer from depression and takes medication for it  Tobacco Screening: Have you used any form of tobacco in the last 30 days? (Cigarettes, Smokeless Tobacco, Cigars, and/or Pipes): Yes Tobacco use, Select all that apply: 5 or more cigarettes per day Are you interested in Tobacco Cessation Medications?: Yes, will notify MD for an order Counseled patient on smoking cessation including recognizing danger situations, developing coping skills and basic information about quitting provided: Yes  Social History: Patient lives with her boyfriend of 9 years they have a 74-year-old daughter together. 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 She is never married doesn't have any other children. She denies any financial stressors at this time. She has a 12th grade education. She has worked in the past and for lying for 2 years and at the [employer] planned for 6 months. She has supportive siblings but they are currently angry at her  for the infidelity. She is not close to her parents    History    History  Alcohol Use No     History  Drug Use  . Types: Marijuana    Social History   Social History  . Marital status: Single    Spouse name: N/A  . Number of children: N/A  . Years of education: N/A   Social History Main Topics  . Smoking status: Current Every Day Smoker    Packs/day: 0.50    Types: Cigarettes  . Smokeless tobacco: Current User  . Alcohol use No  . Drug use:     Types: Marijuana  . Sexual activity: Yes    Birth control/ protection: Condom   Other Topics Concern  . None   Social History Narrative  . None    Hospital Course:    Adjustment disorder with depressed mood patient is no longer voicing thoughts of suicide. She denies symptoms of major depressive disorder prior to Friday.  Possible panic disorder patient reports history of panic attacks in the past and recently has been having them daily. She is in agreement with starting a low-dose of an antidepressant. She understands the risk of these  medications during pregnancy says that she doesn't know if she is going to continue with the pregnancy.  Patient will be started on Lexapro 5 mg by mouth daily  Insomnia patient will be continued on Vistaril as needed for anxiety and for insomnia  Tobacco use disorder patient declines from using nicotine patch  Cocaine use disorder and cannabis use disorder: urine toxicology was positive for patient denies use of cocaine. Patient will benefit from referral to substance abuse treatment  Pregnancy : On prenatal vitamins  On discharge the patient reports feeling significantly improved. She no longer is feeling hopeless or helpless she also denies depressed mood or suicidality. She is future oriented plans to go home and try to get things fixed with her boyfriend. She denies side effects from medications. She denies any physical complaints. She did not display any unsafe or disruptive  behaviors here in the unit. She participated in programming. She had appropriate interactions with peers and staff.  She did not require seclusion, restraints or forced medications  Family has been contacted and made aware of discharge plans. Suicide education has been completed. No concerns about the patient's safety upon discharge   Per nursing:Patient verbalizes that she feels much better.  States that she just needs to figure out what her next step is.  Denies SI/HI/AVH.  Affect sad.  Visible in the milieu.  Interacting appropriately with peers and staff.      Physical Findings: AIMS: Facial and Oral Movements Muscles of Facial Expression: None, normal Lips and Perioral Area: None, normal Jaw: None, normal Tongue: None, normal,Extremity Movements Upper (arms, wrists, hands, fingers): None, normal Lower (legs, knees, ankles, toes): None, normal, Trunk Movements Neck, shoulders, hips: None, normal, Overall Severity Incapacitation due to abnormal movements: None, normal Patient's awareness of abnormal movements (rate only patient's report): No Awareness, Dental Status Current problems with teeth and/or dentures?: No Does patient usually wear dentures?: No  CIWA:    COWS:     Musculoskeletal: Strength & Muscle Tone: within normal limits Gait & Station: normal Patient leans: N/A  Psychiatric Specialty Exam: Physical Exam  Constitutional: She is oriented to person, place, and time. She appears well-developed and well-nourished.  HENT:  Head: Normocephalic and atraumatic.  Eyes: EOM are normal.  Neck: Normal range of motion.  Respiratory: Effort normal.  Musculoskeletal: Normal range of motion.  Neurological: She is alert and oriented to person, place, and time.    Review of Systems  Constitutional: Negative.   HENT: Negative.   Eyes: Negative.   Respiratory: Negative.   Cardiovascular: Negative.   Gastrointestinal: Negative.   Genitourinary: Negative.    Musculoskeletal: Negative.   Skin: Negative.   Neurological: Negative.   Endo/Heme/Allergies: Negative.   Psychiatric/Behavioral: Positive for substance abuse. Negative for depression, hallucinations, memory loss and suicidal ideas. The patient is nervous/anxious. The patient does not have insomnia.     Blood pressure (!) 95/58, pulse 73, temperature 98 F (36.7 C), temperature source Oral, resp. rate 18, height 5' (1.524 m), weight 49.9 kg (110 lb), last menstrual period 01/02/2016, SpO2 100 %.Body mass index is 21.48 kg/m.  General Appearance: Well Groomed  Eye Contact:  Good  Speech:  Clear and Coherent  Volume:  Normal  Mood:  Euthymic  Affect:  Appropriate and Congruent  Thought Process:  Linear and Descriptions of Associations: Intact  Orientation:  Full (Time, Place, and Person)  Thought Content:  Hallucinations: None  Suicidal Thoughts:  No  Homicidal Thoughts:  No  Memory:  Immediate;  Good Recent;   Good Remote;   Good  Judgement:  Fair  Insight:  Fair  Psychomotor Activity:  Normal  Concentration:  Concentration: Good and Attention Span: Good  Recall:  Good  Fund of Knowledge:  Good  Language:  Good  Akathisia:  No  Handed:    AIMS (if indicated):     Assets:  Agricultural consultant Physical Health Social Support  ADL's:  Intact  Cognition:  WNL  Sleep:  Number of Hours: 5.15     Have you used any form of tobacco in the last 30 days? (Cigarettes, Smokeless Tobacco, Cigars, and/or Pipes): Yes  Has this patient used any form of tobacco in the last 30 days? (Cigarettes, Smokeless Tobacco, Cigars, and/or Pipes) Yes, Yes, A prescription for an FDA-approved tobacco cessation medication was offered at discharge and the patient refused  Blood Alcohol level:  Lab Results  Component Value Date   ETH <5 78/67/5449    Metabolic Disorder Labs:  No results found for: HGBA1C, MPG No results found for: PROLACTIN No results found for:  CHOL, TRIG, HDL, CHOLHDL, VLDL, LDLCALC  Results for TAELA, CHARBONNEAU (MRN [id number]) as of 01/07/2016 14:09  Ref. Range 01/06/2016 14:54 01/06/2016 15:10 01/06/2016 18:16  Sodium Latest Ref Range: 135 - 145 mmol/L 140    Potassium Latest Ref Range: 3.5 - 5.1 mmol/L 3.0 (L)    Chloride Latest Ref Range: 101 - 111 mmol/L 105    CO2 Latest Ref Range: 22 - 32 mmol/L 27    BUN Latest Ref Range: 6 - 20 mg/dL 11    Creatinine Latest Ref Range: 0.44 - 1.00 mg/dL 0.85    Calcium Latest Ref Range: 8.9 - 10.3 mg/dL 9.3    EGFR (Non-African Amer.) Latest Ref Range: >60 mL/min >60    EGFR (African American) Latest Ref Range: >60 mL/min >60    Glucose Latest Ref Range: 65 - 99 mg/dL 96    Anion gap Latest Ref Range: 5 - 15  8    Alkaline Phosphatase Latest Ref Range: 38 - 126 U/L 90    Albumin Latest Ref Range: 3.5 - 5.0 g/dL 4.8    AST Latest Ref Range: 15 - 41 U/L 23    ALT Latest Ref Range: 14 - 54 U/L 13 (L)    Total Protein Latest Ref Range: 6.5 - 8.1 g/dL 8.2 (H)    Total Bilirubin Latest Ref Range: 0.3 - 1.2 mg/dL 1.4 (H)    HCG, Beta Chain, Quant, S Latest Ref Range: <5 mIU/mL 17 (H)    WBC Latest Ref Range: 3.6 - 11.0 K/uL 9.4    RBC Latest Ref Range: 3.80 - 5.20 MIL/uL 5.14    Hemoglobin Latest Ref Range: 12.0 - 16.0 g/dL 15.4    HCT Latest Ref Range: 35.0 - 47.0 % 44.4    MCV Latest Ref Range: 80.0 - 100.0 fL 86.4    MCH Latest Ref Range: 26.0 - 34.0 pg 30.1    MCHC Latest Ref Range: 32.0 - 36.0 g/dL 34.8    RDW Latest Ref Range: 11.5 - 14.5 % 12.8    Platelets Latest Ref Range: 150 - 440 K/uL 213    Acetaminophen (Tylenol), S Latest Ref Range: 10 - 30 ug/mL <97 (L)    Salicylate Lvl Latest Ref Range: 2.8 - 30.0 mg/dL 4.5    Preg Test, Ur Latest Ref Range: NEGATIVE   POSITIVE (A)   ABO/RH(D) Unknown O POS    Alcohol, Ethyl (B)  Latest Ref Range: <5 mg/dL <5    Amphetamines, Ur Screen Latest Ref Range: NONE DETECTED  NONE DETECTED    Barbiturates, Ur Screen Latest Ref Range: NONE  DETECTED  NONE DETECTED    Benzodiazepine, Ur Scrn Latest Ref Range: NONE DETECTED  NONE DETECTED    Cocaine Metabolite,Ur [place] Latest Ref Range: NONE DETECTED  POSITIVE (A)    Methadone Scn, Ur Latest Ref Range: NONE DETECTED  NONE DETECTED    MDMA (Ecstasy)Ur Screen Latest Ref Range: NONE DETECTED  NONE DETECTED    Cannabinoid 50 Ng, Ur [place] Latest Ref Range: NONE DETECTED  NONE DETECTED    Opiate, Ur Screen Latest Ref Range: NONE DETECTED  NONE DETECTED    Phencyclidine (PCP) Ur S Latest Ref Range: NONE DETECTED  NONE DETECTED    Tricyclic, Ur Screen Latest Ref Range: NONE DETECTED  NONE DETECTED     CLINICAL DATA:  Threatened miscarriage.  EXAM: OBSTETRIC <14 WK [place] AND TRANSVAGINAL OB US  TECHNIQUE: Both transabdominal and transvaginal ultrasound examinations were performed for complete evaluation of the gestation as well as the maternal uterus, adnexal regions, and pelvic cul-de-sac. Transvaginal technique was performed to assess early pregnancy.  COMPARISON:  None.  FINDINGS: Intrauterine gestational sac: None  Yolk sac:  No  Embryo:  No  Cardiac Activity: No  Subchorionic hemorrhage:  None visualized.  Maternal uterus/adnexae: Right ovary is not visualized. Left ovary measures 2.7 x 1.4 x 2.4 cm. Trace amount of pelvic free fluid.  IMPRESSION: No intrauterine pregnancy is identified. Elevated beta HCG. Differential diagnosis includes pregnancy too early to detect versus missed abortion versus ectopic pregnancy. Recommend clinical correlation, serial quantitative beta HCGs, ectopic precautions, and followup ultrasound as clinically indicated.  See Psychiatric Specialty Exam and Suicide Risk Assessment completed by Attending Physician prior to discharge.  Discharge destination:  Home  Is patient on multiple antipsychotic therapies at discharge:  No   Has Patient had three or more failed trials of antipsychotic monotherapy by history:  No  Recommended Plan  for Multiple Antipsychotic Therapies: NA     Medication List    TAKE these medications     Indication  escitalopram 5 MG tablet Commonly known as:  LEXAPRO Take 1 tablet (5 mg total) by mouth daily.  Indication:  anxiety   hydrOXYzine 25 MG tablet Commonly known as:  ATARAX/VISTARIL Take 1 tablet (25 mg total) by mouth 3 (three) times daily as needed for anxiety (sleep).  Indication:  anxiety   multivitamin-lutein Caps capsule Take 1 capsule by mouth daily.  Indication:  pregnancy      Follow-up [facility]. Go in 3 day(s).   Why:  Go to [facility] during walk-in hours on M,W,F 8am-3pm. Please arrive as early as possible to ensure prompt service. [facility] will provide medication management and outpatient therapy services. [name] is a peer support with [facility]. His number is [phone].  Contact information: [place] [address] [phone]            Signed: Hildred Priest, MD 01/08/2016, 8:45 AM

## 2016-01-08 NOTE — Progress Notes (Signed)
  [facility] Adult Case Management Discharge Plan :  Will you be returning to the same living situation after discharge:  Yes,  Patient will return to living with her Toni' At discharge, do you have transportation home?: Yes,  Toni' Do you have the ability to pay for your medications: No, CSW sent application to refer patient to medication management clinic.   Release of information consent forms completed and in the chart;  Patient's signature needed at discharge.  Patient to Follow up at: [place]. Go in 3 day(s).   Why:  Go to [facility] during walk-in hours on M,W,F 8am-3pm. Please arrive as early as possible to ensure prompt service. [facility] will provide medication management and outpatient therapy services. Toni Blake is a peer support with [facility]. His number is [phone].  Contact information: [address] [place] [address] [phone]           Next level of care provider has access to [place] and Suicide Prevention discussed: Yes,  Toni Blake  Have you used any form of tobacco in the last 30 days? (Cigarettes, Smokeless Tobacco, Cigars, and/or Pipes): Yes  Has patient been referred to the Quitline?: Patient refused referral  Patient has been referred for addiction treatment: Yes  Toni Blake G. [place], [place] 01/08/2016, 10:30 AM

## 2016-01-08 NOTE — Progress Notes (Signed)
Patient with appropriate affect, cooperative behavior with meals, meds and plan of care. No SI/HI at this time. Social with select female peer. Verbalizes needs appropriately with staff. Patient educated to report to Health Department in one week for repeat blood work to confirm pregnancy. Patient to discharge today when discharge plan and 7 day supply of medicines ready. Safety maintained.

## 2016-01-08 NOTE — Tx Team (Signed)
Interdisciplinary Treatment Plan Update (Adult)  Date:  01/08/2016 Time Reviewed:  10:18 AM  Progress in Treatment: Attending groups: Yes. Participating in groups:  Yes. Taking medication as prescribed:  Yes. Tolerating medication:  Yes. Family/Significant othe contact made:  Yes, individual(s) contacted:  ex-fiance' Patient understands diagnosis:  Yes. Discussing patient identified problems/goals with staff:  Yes. Medical problems stabilized or resolved:  Yes. Denies suicidal/homicidal ideation: Yes. Issues/concerns per patient self-inventory:  No. Other:  New problem(s) identified: n/a  Discharge Plan or Barriers: Patient will discharge back home to live with her ex-fiance'. Patient has follow-up with [facility] for medication management and outpatient therapy. Patient has also been referred to medication management clinic for assistance with paying for her medications.   Reason for Continuation of Hospitalization: Medication stabilization  Comments: Patient is scheduled to discharge 01/08/2016  Estimated length of stay: Patient is discharging on 01/08/2016  New goal(s): n/a  Review of initial/current patient goals per problem list:   1.? Goal(s): Patient will participate in aftercare plan o Met: Yes?  o Target date: at discharge  o As evidenced by: Patient will participate within aftercare plan AEB aftercare provider and housing plan at discharge being identified.  ? 2.? Goal (s): Patient will exhibit decreased depressive symptoms and suicidal ideations. o Met:?Yes  o ?Target date: at discharge  o As evidenced by: Patient will utilize self rating of depression at 3 or below and demonstrate decreased signs of depression or be deemed stable for discharge by MD.    Attendees: Patient:  Toni Blake 8/27/201710:18 AM  Family:   8/27/201710:18 AM  Physician:  Dr. Hildred Priest, MD 8/27/201710:18 AM  Nursing:   Carolynn Sayers, RN 8/27/201710:18 AM  Case Manager:    8/27/201710:18 AM  Counselor:   8/27/201710:18 AM  Other:  Lear Ng. Jillane Po MSW, [place] 8/27/201710:18 AM  Other:   8/27/201710:18 AM  Other:   8/27/201710:18 AM  Other:  8/27/201710:18 AM  Other:  8/27/201710:18 AM  Other:  8/27/201710:18 AM  Other:  8/27/201710:18 AM  Other:  8/27/201710:18 AM  Other:  8/27/201710:18 AM  Other:   8/27/201710:18 AM   Scribe for Treatment Team:   Lear Ng. [place], [facility] 01/08/2016 10:23 AM

## 2016-01-08 NOTE — Progress Notes (Signed)
Pt denies SI/AVH. Did not attend evening group. Forwards little, guarded. Visible in milieu with minimal interaction. Requested PRN vistaril for sleep. Denies pain. Voices no additional concerns at this time. Safety maintained.

## 2016-02-03 ENCOUNTER — Ambulatory Visit: Payer: Self-pay

## 2016-02-16 ENCOUNTER — Ambulatory Visit: Payer: Self-pay

## 2016-05-14 NOTE — L&D Delivery Note (Signed)
Date of delivery: 01/04/2017 Estimated Date of Delivery: 01/01/17 Patient's last menstrual period was 03/27/2016. EGA: [redacted]w[redacted]d  Delivery Note At 9:37 AM a viable female was delivered via Vaginal, Spontaneous Delivery (Presentation: OA;  ROA).  APGAR: 7, 9; weight 7 lb 0.5 oz (3190 g).   Placenta status: spontaneous, intact.  Cord:  with the following complications: none.  Cord pH: NA  Mom felt increasing pressure with bulging bag of water at 8 cm. Following second dose of Abx, AROM for clear/pink fluid and then a rim of cervix. Moved to hands and knees for a few contractions and then she was complete. Mom pushed to deliver a viable female infant.  The head followed by shoulders, which delivered without difficulty, and the rest of the body.  No nuchal cord noted.  Baby to mom's chest.  Cord clamped and cut after 3 min delay.  Cord blood obtained.  Placenta delivered spontaneously, intact, with a 3-vessel cord.  All counts correct.  Hemostasis obtained with IV pitocin and fundal massage.    Anesthesia:  Nitrous oxide Episiotomy: None Lacerations:  Small split periurethral, hemostatic, no repair Suture Repair: NA Est. Blood Loss (mL): 100  Mom to postpartum.  Baby to Couplet care / Skin to Skin.  Rod Can, CNM 01/04/2017, 10:07 AM

## 2016-06-05 ENCOUNTER — Other Ambulatory Visit: Payer: Self-pay | Admitting: Nurse Practitioner

## 2016-06-05 DIAGNOSIS — Z369 Encounter for antenatal screening, unspecified: Secondary | ICD-10-CM

## 2016-06-28 ENCOUNTER — Ambulatory Visit
Admission: RE | Admit: 2016-06-28 | Discharge: 2016-06-28 | Disposition: A | Discharge status: Home / Self Care | Payer: Medicaid Other | Source: Ambulatory Visit | Attending: Obstetrics and Gynecology | Admitting: Obstetrics and Gynecology

## 2016-06-28 ENCOUNTER — Ambulatory Visit (HOSPITAL_BASED_OUTPATIENT_CLINIC_OR_DEPARTMENT_OTHER)
Admission: RE | Admit: 2016-06-28 | Discharge: 2016-06-28 | Disposition: A | Discharge status: Home / Self Care | Payer: Medicaid Other | Source: Ambulatory Visit | Attending: Nurse Practitioner | Admitting: Nurse Practitioner

## 2016-06-28 VITALS — BP 110/67 | HR 103 | Temp 98.4°F | Resp 18 | Wt 120.8 lb

## 2016-06-28 DIAGNOSIS — Z369 Encounter for antenatal screening, unspecified: Secondary | ICD-10-CM

## 2016-06-28 DIAGNOSIS — Z3689 Encounter for other specified antenatal screening: Secondary | ICD-10-CM | POA: Insufficient documentation

## 2016-06-28 DIAGNOSIS — Z3A13 13 weeks gestation of pregnancy: Secondary | ICD-10-CM | POA: Insufficient documentation

## 2016-06-28 NOTE — Progress Notes (Addendum)
Toni Blake Length of Consultation: 40 minutes   Toni Blake  was referred to [facility] for genetic counseling to review prenatal screening and testing options.  This note summarizes the information we discussed.    We offered the following routine screening tests for this pregnancy:  First trimester screening, which includes nuchal translucency ultrasound screen and first trimester maternal serum marker screening.  The nuchal translucency has approximately an 80% detection rate for Down syndrome and can be positive for other chromosome abnormalities as well as congenital heart defects.  When combined with a maternal serum marker screening, the detection rate is up to 90% for Down syndrome and up to 97% for trisomy 18.     Maternal serum marker screening, a blood test that measures pregnancy proteins, can provide risk assessments for Down syndrome, trisomy 18, and open neural tube defects (spina bifida, anencephaly). Because it does not directly examine the fetus, it cannot positively diagnose or rule out these problems.  Targeted ultrasound uses high frequency sound waves to create an image of the developing fetus.  An ultrasound is often recommended as a routine means of evaluating the pregnancy.  It is also used to screen for fetal anatomy problems (for example, a heart defect) that might be suggestive of a chromosomal or other abnormality.   Should these screening tests indicate an increased concern, then the following additional testing options would be offered:  The chorionic villus sampling procedure is available for first trimester chromosome analysis.  This involves the withdrawal of a small amount of chorionic villi (tissue from the developing placenta).  Risk of pregnancy loss is estimated to be approximately 1 in 200 to 1 in 100 (0.5 to 1%).  There is approximately a 1% (1 in 100) chance that the CVS chromosome results will be unclear.  Chorionic villi cannot  be tested for neural tube defects.     Amniocentesis involves the removal of a small amount of amniotic fluid from the sac surrounding the fetus with the use of a thin needle inserted through the maternal abdomen and uterus.  Ultrasound guidance is used throughout the procedure.  Fetal cells from amniotic fluid are directly evaluated and > 99.5% of chromosome problems and > 98% of open neural tube defects can be detected. This procedure is generally performed after the 15th week of pregnancy.  The main risks to this procedure include complications leading to miscarriage in less than 1 in 200 cases (0.5%).  As another option for information if the pregnancy is suspected to be an an increased chance for certain chromosome conditions, we also reviewed the availability of cell free fetal DNA testing from maternal blood to determine whether or not the baby may have either Down syndrome, trisomy 33, or trisomy 30.  This test utilizes a maternal blood sample and DNA sequencing technology to isolate circulating cell free fetal DNA from maternal plasma.  The fetal DNA can then be analyzed for DNA sequences that are derived from the three most common chromosomes involved in aneuploidy, chromosomes 13, 18, and 21.  If the overall amount of DNA is greater than the expected level for any of these chromosomes, aneuploidy is suspected.  While we do not consider it a replacement for invasive testing and karyotype analysis, a negative result from this testing would be reassuring, though not a guarantee of a normal chromosome complement for the baby.  An abnormal result is certainly suggestive of an abnormal chromosome complement, though we would still recommend  CVS or amniocentesis to confirm any findings from this testing.   Cystic Fibrosis and Spinal Muscular Atrophy (SMA) screening were also discussed with the patient. Both conditions are recessive, which means that both parents must be carriers in order to have a child  with the disease.  Cystic fibrosis (CF) is one of the most common genetic conditions in persons of Caucasian ancestry.  This condition occurs in approximately 1 in 2,500 Caucasian persons and results in thickened secretions in the lungs, digestive, and reproductive systems.  For a baby to be at risk for having CF, both of the parents must be carriers for this condition.  Approximately 1 in 41 Caucasian persons is a carrier for CF.  Current carrier testing looks for the most common mutations in the gene for CF and can detect approximately 90% of carriers in the Caucasian population.  This means that the carrier screening can greatly reduce, but cannot eliminate, the chance for an individual to have a child with CF.  If an individual is found to be a carrier for CF, then carrier testing would be available for the partner. As part of [place] newborn screening profile, all babies born in the state of [place] will have a two-tier screening process.  Specimens are first tested to determine the concentration of immunoreactive trypsinogen (IRT).  The top 5% of specimens with the highest IRT values then undergo DNA testing using a panel of over 40 common CF mutations. SMA is a neurodegenerative disorder that leads to atrophy of skeletal muscle and overall weakness.  This condition is also more prevalent in the Caucasian population, with 1 in 40-1 in 60 persons being a carrier and 1 in 6,000-1 in 10,000 children being affected.  There are multiple forms of the disease, with some causing death in infancy to other forms with survival into adulthood.  The genetics of SMA is complex, but carrier screening can detect up to 95% of carriers in the Caucasian population.  Similar to CF, a negative result can greatly reduce, but cannot eliminate, the chance to have a child with SMA.  We obtained a detailed family history and pregnancy history.  Toni Blake reported that she, her mother and sister all have been  diagnosed with depression.  We reviewed that while most mental health conditions are thought to be due to a combination of genetic and environmental factors, these conditions may have strong genetic factors in some families.   Currently there is no genetic testing for depression or other mental health disorders.  The remainder of the family history was reported to be unremarkable for birth defects, mental retardation, recurrent pregnancy loss or known chromosome abnormalities.  Toni Blake stated that this is her second pregnancy.  She and her partner have a healthy 5 year old son.  She reported no complications or exposures that would be expected to increase the risk for birth defects.  After consideration of the options, Toni Blake elected to proceed with first trimester screening and to decline SMA and CF carrier screening..  An ultrasound was performed at the time of the visit.  The gestational age was consistent with  47 weeks.  Fetal anatomy could not be assessed due to early gestational age.  Please refer to the ultrasound report for details of that study.  Toni Blake was encouraged to call with questions or concerns.  We can be contacted at [phone].    Wilburt Finlay, MS, CGC  I met the patient and reviewed  the genetic history. I agree with plan as outlined. Gatha Mayer MD

## 2016-07-02 ENCOUNTER — Telehealth: Payer: Self-pay | Admitting: Obstetrics and Gynecology

## 2016-07-02 NOTE — Telephone Encounter (Signed)
Toni Blake  elected to undergo First Trimester screening on 06/28/16.  To review, first trimester screening, includes nuchal translucency ultrasound screen and/or first trimester maternal serum marker screening.  The nuchal translucency has approximately an 80% detection rate for Down syndrome and can be positive for other chromosome abnormalities as well as heart defects.  When combined with a maternal serum marker screening, the detection rate is up to 90% for Down syndrome and up to 97% for trisomy 13 and 18.     The results of the First Trimester Nuchal Translucency and Biochemical Screening were within normal range.  The risk for Down syndrome is now estimated to be 1 in 9,660.  The risk for Trisomy 13/18 is 1 in >10,000.  Should more definitive information be desired, we would offer amniocentesis.  Because we do not yet know the effectiveness of combined first and second trimester screening, we do not recommend a maternal serum screen to assess the chance for chromosome conditions.  However, if screening for neural tube defects is desired, maternal serum screening for AFP only can be performed between 15 and [redacted] weeks gestation.     Wilburt Finlay, MS, CGC

## 2016-07-05 ENCOUNTER — Other Ambulatory Visit: Payer: Self-pay | Admitting: Surgery

## 2016-07-05 DIAGNOSIS — N63 Unspecified lump in unspecified breast: Secondary | ICD-10-CM

## 2016-07-17 ENCOUNTER — Ambulatory Visit
Admission: RE | Admit: 2016-07-17 | Discharge: 2016-07-17 | Disposition: A | Discharge status: Home / Self Care | Payer: Medicaid Other | Source: Ambulatory Visit | Attending: Surgery | Admitting: Surgery

## 2016-07-17 ENCOUNTER — Ambulatory Visit: Payer: Medicaid Other

## 2016-07-17 ENCOUNTER — Other Ambulatory Visit: Payer: Self-pay | Admitting: Surgery

## 2016-07-17 DIAGNOSIS — D242 Benign neoplasm of left breast: Secondary | ICD-10-CM | POA: Diagnosis not present

## 2016-07-17 DIAGNOSIS — N63 Unspecified lump in unspecified breast: Secondary | ICD-10-CM

## 2016-07-17 DIAGNOSIS — N6320 Unspecified lump in the left breast, unspecified quadrant: Secondary | ICD-10-CM | POA: Diagnosis present

## 2016-07-17 HISTORY — PX: BREAST BIOPSY: SHX20

## 2016-07-18 LAB — SURGICAL PATHOLOGY

## 2016-08-09 ENCOUNTER — Other Ambulatory Visit: Payer: Self-pay | Admitting: *Deleted

## 2016-08-09 DIAGNOSIS — Z369 Encounter for antenatal screening, unspecified: Secondary | ICD-10-CM

## 2016-08-13 ENCOUNTER — Ambulatory Visit
Admission: RE | Admit: 2016-08-13 | Discharge: 2016-08-13 | Disposition: A | Discharge status: Home / Self Care | Payer: Medicaid Other | Source: Ambulatory Visit | Attending: Obstetrics and Gynecology | Admitting: Obstetrics and Gynecology

## 2016-08-13 VITALS — BP 115/68 | HR 85 | Temp 97.8°F | Resp 17 | Ht 65.0 in | Wt 135.0 lb

## 2016-08-13 DIAGNOSIS — Z3A19 19 weeks gestation of pregnancy: Secondary | ICD-10-CM | POA: Diagnosis not present

## 2016-08-13 DIAGNOSIS — Z3689 Encounter for other specified antenatal screening: Secondary | ICD-10-CM | POA: Diagnosis not present

## 2016-08-13 DIAGNOSIS — Z369 Encounter for antenatal screening, unspecified: Secondary | ICD-10-CM | POA: Diagnosis present

## 2016-11-20 ENCOUNTER — Encounter: Payer: Self-pay | Admitting: *Deleted

## 2016-11-20 ENCOUNTER — Observation Stay
Admission: EM | Admit: 2016-11-20 | Discharge: 2016-11-20 | Disposition: A | Discharge status: Home / Self Care | Payer: Medicaid Other | Attending: Certified Nurse Midwife | Admitting: Certified Nurse Midwife

## 2016-11-20 DIAGNOSIS — B373 Candidiasis of vulva and vagina: Secondary | ICD-10-CM | POA: Insufficient documentation

## 2016-11-20 DIAGNOSIS — O99333 Smoking (tobacco) complicating pregnancy, third trimester: Secondary | ICD-10-CM | POA: Insufficient documentation

## 2016-11-20 DIAGNOSIS — Z3A34 34 weeks gestation of pregnancy: Secondary | ICD-10-CM | POA: Diagnosis not present

## 2016-11-20 DIAGNOSIS — F17210 Nicotine dependence, cigarettes, uncomplicated: Secondary | ICD-10-CM | POA: Insufficient documentation

## 2016-11-20 DIAGNOSIS — O98813 Other maternal infectious and parasitic diseases complicating pregnancy, third trimester: Secondary | ICD-10-CM | POA: Diagnosis not present

## 2016-11-20 DIAGNOSIS — O4703 False labor before 37 completed weeks of gestation, third trimester: Secondary | ICD-10-CM | POA: Diagnosis present

## 2016-11-20 HISTORY — DX: Anemia, unspecified: D64.9

## 2016-11-20 LAB — URINALYSIS, COMPLETE (UACMP) WITH MICROSCOPIC
BACTERIA UA: NONE SEEN
Bilirubin Urine: NEGATIVE
Glucose, UA: NEGATIVE mg/dL
Ketones, ur: NEGATIVE mg/dL
Nitrite: NEGATIVE
PH: 6 (ref 5.0–8.0)
Protein, ur: NEGATIVE mg/dL
SPECIFIC GRAVITY, URINE: 1.004 — AB (ref 1.005–1.030)

## 2016-11-20 LAB — CHLAMYDIA/NGC RT PCR (ARMC ONLY)
CHLAMYDIA TR: NOT DETECTED
N GONORRHOEAE: NOT DETECTED

## 2016-11-20 MED ORDER — CEPHALEXIN 500 MG PO CAPS: 500.0000 mg | ORAL_CAPSULE | Freq: Two times a day (BID) | ORAL | 0 refills | Status: AC

## 2016-11-20 MED ORDER — CEPHALEXIN 500 MG PO CAPS
500.0000 mg | ORAL_CAPSULE | Freq: Once | ORAL | Status: AC
  Administered 2016-11-20: 500 mg via ORAL
  Filled 2016-11-20: qty 1

## 2016-11-20 MED ORDER — FLUCONAZOLE 150 MG PO TABS: ORAL_TABLET | ORAL | 0 refills | Status: DC

## 2016-11-20 MED ORDER — FLUCONAZOLE 50 MG PO TABS
150.0000 mg | ORAL_TABLET | Freq: Once | ORAL | Status: AC
  Administered 2016-11-20: 150 mg via ORAL
  Filled 2016-11-20: qty 3

## 2016-11-20 NOTE — Discharge Instructions (Signed)
Follow up with next prenatal appointment on 11/27/2016

## 2016-11-20 NOTE — OB Triage Note (Signed)
Pt arrived from home with complaints of intermittent back pain around every 15 min that has been there all day. Pt reports working for the last several hours and has been on her feet. Pt has had intercourse in the last 24 hours. Pt denies any leaking of fluid or vaginal bleeding. Pt reports positive fetal movement, although she has noticed a decrease over the past few hours. EFM and TOCO applied.

## 2016-11-20 NOTE — Progress Notes (Signed)
Pt d/c home in stable condition with significant other. Pt given preterm labor precautions and instructed to follow up with next prenatal appointment. All questions answered.

## 2016-11-20 NOTE — Progress Notes (Signed)
Evert Kohl, CNM present in department. CNM notified of u/a results. CNM reviewed FHR tracing and CTX pattern. CNM to evaluate pt at the bedside.

## 2016-11-20 NOTE — Final Progress Note (Signed)
Physician Final Progress Note  Patient ID: Toni Blake MRN: [id number] DOB/AGE: 09-01-1990 26 y.o.  Admit date: 11/20/2016 Admitting provider: Will Bonnet, MD Discharge date: 11/20/2016   Admission Diagnoses: IUP at 24 weeks with contractions  Discharge Diagnoses:  IUP at 34 weeks Monilial vaginitis Possible UTI Preterm contractions  Consults: None  Significant Findings/ Diagnostic Studies: 26 year old G3 P1011 with EDC=01/01/2017 by LMP presented to L&D with complaints of contracting all day." Contractions every 15 min". No vaginal bleeding. Last IC within past 24 hours. Has been having frequency of urination x 2 days, but no dysuria. Increased thin vaginal discharge, but no vulvar irritation of itching. Vomited once yesterday after getting up, but has been eating and drinking without vomiting since then. No diarrhea, but tends toward constipation with taking iron. Has some indigestion and takes Tums for this.  Prenatal care at [facility] remarkable for depression/anxiety (not on medications), smoking, migraine headaches with aura, left breast mass (biopsy 07/2016 was benign), and anemia.  OB HX significant for SVD of a 7#3oz female in 2013 after IOL for oligohydraminos Review of Systems  Constitutional: Negative for chills and fever.  HENT: Negative for sore throat.        Positive for a broken tooth  Respiratory: Negative for cough.   Cardiovascular: Negative for chest pain.  Gastrointestinal: Positive for constipation, nausea (yesterday, but not currently) and vomiting (once today). Negative for abdominal pain and diarrhea.       Positive for indigestion  Genitourinary: Positive for frequency. Negative for dysuria.  Musculoskeletal: Positive for back pain.  Skin: Negative for itching and rash.   Past Medical History:  Diagnosis Date  . Anemia   . Anxiety   . Depression   . Migraine    Past Surgical History:  Procedure Laterality Date  . 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 Marital status: Single    Spouse name: N/A  . Number of children: 1  . Years of education: N/A   Occupational History  . Not on file.   Social History Main Topics  . Smoking status: Current Every Day Smoker    Packs/day: 1.00    Types: Cigarettes  . Smokeless tobacco: Never Used  . Alcohol use No  . Drug use: No     Comment: Past use of MJ and cocaine  . Sexual activity: Yes    Partners: Male    Birth control/ protection: Condom   Other Topics Concern  . Not on file   Social History Narrative  . No narrative on file   Family History  Problem Relation Age of Onset  . Depression Mother   . Migraines Mother   . Asthma Father   . Depression Sister   . Diabetes Paternal Grandmother    Exam: Temp 97.9 F (36.6 C) (Oral)   Resp 18   Ht 5\' 5"  (1.651 m)   Wt 68 kg (150 lb)   LMP 03/27/2016   BMI 24.96 kg/m   General: WF, gravid in NAD Abdomen: soft, NT uterus. Vertex on Leopold's FHR: 130s with accelerations to 150s to 170s, moderate variability Toco: contractions every 3-8 minutes apart and mild to palpation, not feeling some of the contractions Pelvic: External/BUS: thin off white discharge at introitus Vagina: white curdlike discharge adherent to cervix Wet prep positive for hyphae and TNTC WBCs, negative for trich and clue cells (had to see squamous cells D/T WBCs Cervix: L/T/Closed  internal os/-1 to -2 Results for orders placed or performed during the hospital encounter of 11/20/16 (from the past 24 hour(s))  Urinalysis, Complete w Microscopic     Status: Abnormal   Collection Time: 11/20/16  3:37 AM  Result Value Ref Range   Color, Urine YELLOW (A) YELLOW   APPearance CLEAR (A) CLEAR   Specific Gravity, Urine 1.004 (L) 1.005 - 1.030   pH 6.0 5.0 - 8.0   Glucose, UA NEGATIVE NEGATIVE mg/dL   Hgb urine dipstick SMALL (A) NEGATIVE   Bilirubin Urine NEGATIVE NEGATIVE   Ketones, ur NEGATIVE NEGATIVE mg/dL    Protein, ur NEGATIVE NEGATIVE mg/dL   Nitrite NEGATIVE NEGATIVE   Leukocytes, UA MODERATE (A) NEGATIVE   RBC / HPF 0-5 0 - 5 RBC/hpf   WBC, UA 6-30 0 - 5 WBC/hpf   Bacteria, UA NONE SEEN NONE SEEN   Squamous Epithelial / LPF 0-5 (A) NONE SEEN  A: IUP at 34 weeks with preterm contractions-no cervical change Monilial vaginitis R/O UTI P: Given first dose of Diflucan and Keflex Urine culture Chlamydia/GC NAAT RX for Keflex 500 mgm BID x 7, Diflucan 1590 mgm po x 1 dose in 3 days No IC x 1 week Continue po fluids FU at [facility] as scheduled next week  Procedures: none  Discharge Condition: stable  Disposition: 01-Home or Self Care  Diet: Regular diet  Discharge Activity: Activity as tolerated   Allergies as of 11/20/2016   No Known Allergies     Medication List    TAKE these medications   cephALEXin 500 MG capsule Commonly known as:  KEFLEX Take 1 capsule (500 mg total) by mouth 2 (two) times daily.   ferrous sulfate 325 (65 FE) MG EC tablet Take 325 mg by mouth 3 (three) times daily with meals.   fluconazole 150 MG tablet Commonly known as:  DIFLUCAN Take one tablet on 13 July   multivitamin-lutein Caps capsule Take 1 capsule by mouth daily.   multivitamin-prenatal 27-0.8 MG Tabs tablet Take 1 tablet by mouth daily at 12 noon.        Total time spent taking care of this patient: 20 minutes  Signed: Dalia Heading 11/20/2016, 5:33 AM

## 2016-11-20 NOTE — Progress Notes (Signed)
Evert Kohl, CNM present at bedside discussing discharge precautions and instructions to follow up with regular prenatal appointment. CNM also discussed prescriptions to take. CNM reviewed FHR tracing and CTX pattern. EFM and TOCO d/c.

## 2016-11-20 NOTE — Progress Notes (Signed)
Evert Kohl, CNM present in department. CNM notified of pt arrival, complaint, and assesement. Orders received.

## 2016-11-20 NOTE — Progress Notes (Signed)
Evert Kohl, CNM present at pt bedside discussing u/a results, reviewed FHR tracing and ctx pattern. CNM performed SVE and speculum exam.

## 2016-11-21 LAB — URINE CULTURE
Culture: NO GROWTH
Special Requests: NORMAL

## 2016-12-12 ENCOUNTER — Other Ambulatory Visit: Payer: Self-pay | Admitting: Nurse Practitioner

## 2016-12-12 DIAGNOSIS — Z3689 Encounter for other specified antenatal screening: Secondary | ICD-10-CM

## 2016-12-13 ENCOUNTER — Other Ambulatory Visit: Payer: Self-pay | Admitting: Obstetrics & Gynecology

## 2016-12-13 DIAGNOSIS — O26843 Uterine size-date discrepancy, third trimester: Secondary | ICD-10-CM

## 2016-12-14 ENCOUNTER — Ambulatory Visit (INDEPENDENT_AMBULATORY_CARE_PROVIDER_SITE_OTHER): Payer: Medicaid Other

## 2016-12-14 DIAGNOSIS — O26843 Uterine size-date discrepancy, third trimester: Secondary | ICD-10-CM | POA: Diagnosis not present

## 2016-12-17 ENCOUNTER — Ambulatory Visit: Payer: Medicaid Other

## 2016-12-31 ENCOUNTER — Observation Stay
Admission: EM | Admit: 2016-12-31 | Discharge: 2016-12-31 | Disposition: A | Discharge status: Home / Self Care | Payer: Medicaid Other | Attending: Obstetrics and Gynecology | Admitting: Obstetrics and Gynecology

## 2016-12-31 DIAGNOSIS — O99343 Other mental disorders complicating pregnancy, third trimester: Secondary | ICD-10-CM | POA: Insufficient documentation

## 2016-12-31 DIAGNOSIS — O471 False labor at or after 37 completed weeks of gestation: Secondary | ICD-10-CM | POA: Diagnosis present

## 2016-12-31 DIAGNOSIS — O99333 Smoking (tobacco) complicating pregnancy, third trimester: Secondary | ICD-10-CM | POA: Insufficient documentation

## 2016-12-31 DIAGNOSIS — Z3A39 39 weeks gestation of pregnancy: Secondary | ICD-10-CM | POA: Insufficient documentation

## 2016-12-31 NOTE — Discharge Summary (Signed)
Physician Final Progress Note  Patient ID: REGHAN THUL MRN: [id number] DOB/AGE: May 17, 1990 26 y.o.  Admit date: 12/31/2016 Admitting provider: Rod Can, CNM Discharge date: 12/31/2016   Admission Diagnoses: Contractions  Discharge Diagnoses:  Active Problems:   Indication for care in labor and delivery, antepartum IUP at 39 weeks, 6 days, reactive NST, not in labor  History of Present Illness: The patient is a 26 y.o. female G3P1011 at [redacted]w[redacted]d who presents for contractions that began around 7 pm tonight and went on for an hour. They stopped for about an hour and then resumed around 9 pm. The patient states she feels some of them stronger than others. She admits positive fetal movement. She denies leakage of fluid or vaginal bleeding.    Past Medical History:  Diagnosis Date  . Anemia   . Anxiety   . Depression   . Migraine     Past Surgical History:  Procedure Laterality Date  . 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    Current Outpatient Prescriptions on File Prior to Encounter  Medication Sig Dispense Refill  . ferrous sulfate 325 (65 FE) MG EC tablet Take 325 mg by mouth 3 (three) times daily with meals.    . Prenatal Vit-Fe Fumarate-FA (MULTIVITAMIN-PRENATAL) 27-0.8 MG TABS tablet Take 1 tablet by mouth daily at 12 noon.    . fluconazole (DIFLUCAN) 150 MG tablet Take one tablet on 13 July (Patient not taking: Reported on 12/31/2016) 1 tablet 0  . multivitamin-lutein (OCUVITE-LUTEIN) CAPS capsule Take 1 capsule by mouth daily. (Patient not taking: Reported on 06/28/2016)  0    No Known Allergies  Social History   Social History  . Marital status: Single    Spouse name: N/A  . Number of children: 1  . Years of education: N/A   Occupational History  . Not on file.   Social History Main Topics  . Smoking status: Current Every Day Smoker    Packs/day: 1.00    Types:  Cigarettes  . Smokeless tobacco: Never Used  . Alcohol use No  . Drug use: No     Comment: Past use of MJ and cocaine  . Sexual activity: Yes    Partners: Male    Birth control/ protection: Condom   Other Topics Concern  . Not on file   Social History Narrative  . No narrative on file    Physical Exam: Ht 5\' 5"  (1.651 m)   Wt 156 lb (70.8 kg)   LMP 03/27/2016   BMI 25.96 kg/m   Gen: NAD CV: RRR Pulm: CTAB Pelvic: 1.5 cm/50/-3 posterior per RN A. Evans on admission and no change prior to discharge Toco: every 3-6 minutes Fetal Well Being: 150 bpm, moderate variability, +accelerations, -decelerations Ext: no evidence of DVT  Consults: None  Significant Findings/ Diagnostic Studies: none  Procedures: NST  Discharge Condition: good  Disposition: 01-Home or Self Care  Diet: Regular diet  Discharge Activity: Activity as tolerated  Discharge Instructions    Discharge activity:  No Restrictions    Complete by:  As directed    Discharge diet:  No restrictions    Complete by:  As directed    Fetal Kick Count:  Lie on our left side for one hour after a meal, and count the number of times your baby kicks.  If it is less than 5 times, get up, move around and drink some juice.  Repeat  the test 30 minutes later.  If it is still less than 5 kicks in an hour, notify your doctor.    Complete by:  As directed    LABOR:  When conractions begin, you should start to time them from the beginning of one contraction to the beginning  of the next.  When contractions are 5 - 10 minutes apart or less and have been regular for at least an hour, you should call your health care provider.    Complete by:  As directed    No sexual activity restrictions    Complete by:  As directed    Notify physician for bleeding from the vagina    Complete by:  As directed    Notify physician for blurring of vision or spots before the eyes    Complete by:  As directed    Notify physician for chills or  fever    Complete by:  As directed    Notify physician for fainting spells, "black outs" or loss of consciousness    Complete by:  As directed    Notify physician for increase in vaginal discharge    Complete by:  As directed    Notify physician for leaking of fluid    Complete by:  As directed    Notify physician for pain or burning when urinating    Complete by:  As directed    Notify physician for pelvic pressure (sudden increase)    Complete by:  As directed    Notify physician for severe or continued nausea or vomiting    Complete by:  As directed    Notify physician for sudden gushing of fluid from the vagina (with or without continued leaking)    Complete by:  As directed    Notify physician for sudden, constant, or occasional abdominal pain    Complete by:  As directed    Notify physician if baby moving less than usual    Complete by:  As directed      Allergies as of 12/31/2016   No Known Allergies     Medication List    STOP taking these medications   fluconazole 150 MG tablet Commonly known as:  DIFLUCAN   multivitamin-lutein Caps capsule     TAKE these medications   ferrous sulfate 325 (65 FE) MG EC tablet Take 325 mg by mouth 3 (three) times daily with meals.   multivitamin-prenatal 27-0.8 MG Tabs tablet Take 1 tablet by mouth daily at 12 noon.   Tylenol PM as needed for sleep for early labor   Follow-up Information    Department, [facility] Follow up.   Why:  please keep schedules OB appointment Contact information: [place] [address] [phone]           Total time spent taking care of this patient: 15 minutes  Signed: Rod Can, CNM  12/31/2016, 11:34 PM

## 2016-12-31 NOTE — OB Triage Note (Signed)
Patient came in for observation for labor evaluation. Patient reports contractions every five to ten minutes that started around 2130. PAtient rates pain 9 out of 10. Patient reports + FM. Patient denies leaking of fluid, denies vaginal bleeding and spotting. Vital signs stable and patient afebrile. FHR baseline 130 with moderate variability with accelerations 15 x 15 and no decelerations. Significant other at bedside. Will continue to monitor.

## 2016-12-31 NOTE — Discharge Summary (Signed)
Patient discharged with instructions on follow up appointment, pain management, labor precautions, and when to seek medical attention. Patient verbalized understanding of discharge instructions. Patient ambulatory at discharge with steady gait and no complaints. Patient discharged with significant other. Patient reports + FM at discharge.

## 2017-01-01 DIAGNOSIS — O471 False labor at or after 37 completed weeks of gestation: Secondary | ICD-10-CM | POA: Diagnosis not present

## 2017-01-04 ENCOUNTER — Inpatient Hospital Stay
Admission: EM | Admit: 2017-01-04 | Discharge: 2017-01-05 | DRG: 775 | Disposition: A | Discharge status: Home / Self Care | Payer: Medicaid Other | Attending: Obstetrics & Gynecology | Admitting: Obstetrics & Gynecology

## 2017-01-04 DIAGNOSIS — Z87891 Personal history of nicotine dependence: Secondary | ICD-10-CM | POA: Diagnosis not present

## 2017-01-04 DIAGNOSIS — O99344 Other mental disorders complicating childbirth: Principal | ICD-10-CM | POA: Diagnosis present

## 2017-01-04 DIAGNOSIS — Z3A40 40 weeks gestation of pregnancy: Secondary | ICD-10-CM

## 2017-01-04 DIAGNOSIS — F4321 Adjustment disorder with depressed mood: Secondary | ICD-10-CM | POA: Diagnosis present

## 2017-01-04 DIAGNOSIS — Z3490 Encounter for supervision of normal pregnancy, unspecified, unspecified trimester: Secondary | ICD-10-CM

## 2017-01-04 DIAGNOSIS — Z3493 Encounter for supervision of normal pregnancy, unspecified, third trimester: Secondary | ICD-10-CM | POA: Diagnosis present

## 2017-01-04 DIAGNOSIS — F410 Panic disorder [episodic paroxysmal anxiety] without agoraphobia: Secondary | ICD-10-CM | POA: Diagnosis present

## 2017-01-04 LAB — CBC
HEMATOCRIT: 34 % — AB (ref 35.0–47.0)
HEMOGLOBIN: 11.7 g/dL — AB (ref 12.0–16.0)
MCH: 31.2 pg (ref 26.0–34.0)
MCHC: 34.5 g/dL (ref 32.0–36.0)
MCV: 90.6 fL (ref 80.0–100.0)
Platelets: 207 10*3/uL (ref 150–440)
RBC: 3.75 MIL/uL — AB (ref 3.80–5.20)
RDW: 14.7 % — ABNORMAL HIGH (ref 11.5–14.5)
WBC: 16.9 10*3/uL — ABNORMAL HIGH (ref 3.6–11.0)

## 2017-01-04 LAB — TYPE AND SCREEN
ABO/RH(D): O POS
Antibody Screen: NEGATIVE

## 2017-01-04 MED ORDER — IBUPROFEN 600 MG PO TABS
ORAL_TABLET | ORAL | Status: AC
  Filled 2017-01-04: qty 1

## 2017-01-04 MED ORDER — OXYTOCIN 40 UNITS IN LACTATED RINGERS INFUSION - SIMPLE MED: 1.0000 m[IU]/min | INTRAVENOUS | Status: DC

## 2017-01-04 MED ORDER — BENZOCAINE-MENTHOL 20-0.5 % EX AERO: 1.0000 "application " | INHALATION_SPRAY | CUTANEOUS | Status: DC | PRN

## 2017-01-04 MED ORDER — BUTORPHANOL TARTRATE 2 MG/ML IJ SOLN
1.0000 mg | INTRAMUSCULAR | Status: DC | PRN
  Administered 2017-01-04 (×2): 1 mg via INTRAVENOUS
  Filled 2017-01-04: qty 1

## 2017-01-04 MED ORDER — PRENATAL MULTIVITAMIN CH
1.0000 | ORAL_TABLET | Freq: Every day | ORAL | Status: DC
  Administered 2017-01-04 – 2017-01-05 (×2): 1 via ORAL
  Filled 2017-01-04 (×2): qty 1

## 2017-01-04 MED ORDER — LACTATED RINGERS IV SOLN: INTRAVENOUS | Status: DC

## 2017-01-04 MED ORDER — ONDANSETRON HCL 4 MG/2ML IJ SOLN: 4.0000 mg | Freq: Four times a day (QID) | INTRAMUSCULAR | Status: DC | PRN

## 2017-01-04 MED ORDER — ONDANSETRON HCL 4 MG/2ML IJ SOLN
4.0000 mg | INTRAMUSCULAR | Status: DC | PRN
Start: 2017-01-04 — End: 2017-01-05

## 2017-01-04 MED ORDER — DIBUCAINE 1 % RE OINT: 1.0000 "application " | TOPICAL_OINTMENT | RECTAL | Status: DC | PRN

## 2017-01-04 MED ORDER — ACETAMINOPHEN 325 MG PO TABS: 650.0000 mg | ORAL_TABLET | ORAL | Status: DC | PRN

## 2017-01-04 MED ORDER — COCONUT OIL OIL: 1.0000 "application " | TOPICAL_OIL | Status: DC | PRN

## 2017-01-04 MED ORDER — SENNOSIDES-DOCUSATE SODIUM 8.6-50 MG PO TABS
2.0000 | ORAL_TABLET | ORAL | Status: DC
  Administered 2017-01-05: 2 via ORAL
  Filled 2017-01-04: qty 2

## 2017-01-04 MED ORDER — OXYTOCIN BOLUS FROM INFUSION
500.0000 mL | Freq: Once | INTRAVENOUS | Status: AC
  Administered 2017-01-04: 500 mL via INTRAVENOUS

## 2017-01-04 MED ORDER — PENICILLIN G POT IN DEXTROSE 60000 UNIT/ML IV SOLN
3.0000 10*6.[IU] | INTRAVENOUS | Status: DC
  Administered 2017-01-04: 3 10*6.[IU] via INTRAVENOUS
  Filled 2017-01-04 (×5): qty 50

## 2017-01-04 MED ORDER — DIPHENHYDRAMINE HCL 25 MG PO CAPS: 25.0000 mg | ORAL_CAPSULE | Freq: Four times a day (QID) | ORAL | Status: DC | PRN

## 2017-01-04 MED ORDER — IBUPROFEN 600 MG PO TABS
600.0000 mg | ORAL_TABLET | Freq: Four times a day (QID) | ORAL | Status: DC
  Administered 2017-01-04 – 2017-01-05 (×5): 600 mg via ORAL
  Filled 2017-01-04 (×4): qty 1

## 2017-01-04 MED ORDER — OXYTOCIN 40 UNITS IN LACTATED RINGERS INFUSION - SIMPLE MED
INTRAVENOUS | Status: AC
  Filled 2017-01-04: qty 1000

## 2017-01-04 MED ORDER — AMMONIA AROMATIC IN INHA
RESPIRATORY_TRACT | Status: AC
  Filled 2017-01-04: qty 10

## 2017-01-04 MED ORDER — PENICILLIN G POTASSIUM 5000000 UNITS IJ SOLR
5.0000 10*6.[IU] | Freq: Once | INTRAVENOUS | Status: AC
  Administered 2017-01-04: 5 10*6.[IU] via INTRAVENOUS
  Filled 2017-01-04: qty 5

## 2017-01-04 MED ORDER — OXYTOCIN 10 UNIT/ML IJ SOLN
INTRAMUSCULAR | Status: AC
  Filled 2017-01-04: qty 2

## 2017-01-04 MED ORDER — MISOPROSTOL 200 MCG PO TABS
ORAL_TABLET | ORAL | Status: AC
  Filled 2017-01-04: qty 4

## 2017-01-04 MED ORDER — TETANUS-DIPHTH-ACELL PERTUSSIS 5-2.5-18.5 LF-MCG/0.5 IM SUSP: 0.5000 mL | Freq: Once | INTRAMUSCULAR | Status: DC

## 2017-01-04 MED ORDER — ONDANSETRON HCL 4 MG PO TABS: 4.0000 mg | ORAL_TABLET | ORAL | Status: DC | PRN

## 2017-01-04 MED ORDER — LIDOCAINE HCL (PF) 1 % IJ SOLN
INTRAMUSCULAR | Status: AC
  Filled 2017-01-04: qty 30

## 2017-01-04 MED ORDER — LACTATED RINGERS IV SOLN
INTRAVENOUS | Status: DC
  Administered 2017-01-04: 05:00:00 via INTRAVENOUS

## 2017-01-04 MED ORDER — OXYTOCIN 40 UNITS IN LACTATED RINGERS INFUSION - SIMPLE MED: 2.5000 [IU]/h | INTRAVENOUS | Status: DC

## 2017-01-04 MED ORDER — SIMETHICONE 80 MG PO CHEW: 80.0000 mg | CHEWABLE_TABLET | ORAL | Status: DC | PRN

## 2017-01-04 MED ORDER — WITCH HAZEL-GLYCERIN EX PADS: 1.0000 "application " | MEDICATED_PAD | CUTANEOUS | Status: DC | PRN

## 2017-01-04 NOTE — H&P (Signed)
Obstetrics Admission History & Physical   Laboring   HPI:  26 y.o. [id number] @ [redacted]w[redacted]d (01/01/2017, by Last Menstrual Period). Admitted on 01/04/2017:   Patient Active Problem List   Diagnosis Date Noted  . Indication for care in labor and delivery, antepartum 12/31/2016  . Preterm uterine contractions in third trimester, antepartum 11/20/2016  . First trimester screening   . Panic disorder 01/07/2016  . Tobacco use disorder 01/07/2016  . Adjustment disorder with depressed mood 01/06/2016  . Suicidal ideation 01/06/2016  . Migraine 01/06/2016  . Cocaine abuse 01/06/2016  . Pregnancy <1 week 01/06/2016     Presents for ctxs and labor.   Prenatal care at: at [facility] Pregnancy complicated by none.  ROS: A review of systems was performed and negative, except as stated in the above HPI. PMHx:  Past Medical History:  Diagnosis Date  . Anemia   . Anxiety   . Depression   . Migraine    PSHx:  Past Surgical History:  Procedure Laterality Date  . 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 ferrous sulfate 325 (65 FE) MG EC tablet Take 325 mg by mouth 3 (three) times daily with meals.   01/03/2017 at Unknown time  . Prenatal Vit-Fe Fumarate-FA (MULTIVITAMIN-PRENATAL) 27-0.8 MG TABS tablet Take 1 tablet by mouth daily at 12 noon.   01/03/2017 at Unknown time   Allergies: has No Known Allergies. OBHx:  OB History  Gravida Para Term Preterm AB Living  3 1 1   1 1   SAB TAB Ectopic Multiple Live Births  1       1    # Outcome Date GA Lbr Len/2nd Weight Sex Delivery Anes PTL Lv  3 Current           2 Term 07/28/11 [redacted]w[redacted]d  7 lb 3 oz (3.26 kg) F Vag-Spont   LIV     Complications: Oligohydramnios  1 SAB      SAB        AJO:INOMVEHM/CNOBSJGGEZMO except as detailed in HPI.Marland Kitchen  No family history of birth defects. Soc Hx: Alcohol: none and Recreational drug use: none  Objective:   Vitals:    01/04/17 0350  BP: 115/71  Pulse: 92  Resp: 18  Temp: 98.3 F (36.8 C)   Constitutional: Well nourished, well developed female in no acute distress.  HEENT: normal Skin: Warm and dry.  Cardiovascular:Regular rate and rhythm.   Extremity: trace to 1+ bilateral pedal edema Respiratory: Clear to auscultation bilateral. Normal respiratory effort Abdomen: mild Back: no CVAT Neuro: DTRs 2+, Cranial nerves grossly intact Psych: Alert and Oriented x3. No memory deficits. Normal mood and affect.  MS: normal gait, normal bilateral lower extremity ROM/strength/stability.  Pelvic exam: is not limited by body habitus EGBUS: within normal limits Vagina: within normal limits and with normal mucosa blood in the vault Cervix: 6/90/-1 on admisission Uterus: Spontaneous uterine activity  Adnexa: not evaluated  EFM:FHR: 140 bpm, variability: mod,  accelerations:  Present,  decelerations:  Absent Toco: Frequency: Every 2-4 minutes   Perinatal info:  Blood type: O positive Rubella- Immune Varicella -Immune TDaP tetanus status unknown to the patient, Given during third trimester of this pregnancy RPR NR / HIV Neg/ HBsAg Neg   Assessment & Plan:   26 y.o. [id number] @ [redacted]w[redacted]d, Admitted on 01/04/2017:Active Labor Admit for labor, Antibiotics for GBS prophylaxis, Observe for cervical change, Fetal Wellbeing Reassuring and  AROM when Appropriate  Barnett Applebaum, MD, [facility], [facility] 01/04/2017  8:06 AM

## 2017-01-04 NOTE — OB Triage Note (Signed)
0340 pt to LDR 3 with C/O labor.  Pt to bathrrom to change into hospital gown.  EFM explained and applied at this time no acute distress noted.  No needs voiced at this time.  Will continue to monitor

## 2017-01-04 NOTE — Discharge Summary (Signed)
OB Discharge Summary     Patient Name: Toni Blake DOB: 1990/06/26 MRN: [id number]  Date of admission: 01/04/2017 Delivering provider: Rod Can, CNM  Date of Delivery: 01/04/2017  Date of discharge: 01/05/2017  Admitting diagnosis: 56 Weeks Contractions Intrauterine pregnancy: [redacted]w[redacted]d     Secondary diagnosis: panic disorder, history of cocaine use, tobacco use, adjustment disorder with depressed mood, history of suicidal ideation, migraine     Discharge diagnosis: Term Pregnancy Delivered                                                                                                Post partum procedures:none  Augmentation: AROM  Complications: None  Hospital course:  Onset of Labor With Vaginal Delivery     26 y.o. yo G3P1011 at [redacted]w[redacted]d was admitted in Active Labor on 01/04/2017. Patient had an uncomplicated labor course as follows:  Membrane Rupture Time/Date: 8:59 AM ,01/04/2017   Patient had delivery of viable female at 9:37 AM, 01/04/2017 Details of delivery can be found in a separate delivery note.  Patient had an uncomplicated postpartum course.   She is ambulating, tolerating a regular diet, passing flatus, and urinating well.  Patient is discharged home in stable condition on 01/05/17.   Physical exam  Vitals:   01/04/17 1919 01/05/17 0054 01/05/17 0359 01/05/17 0732  BP: (!) 114/57 109/61 104/62 (!) 101/54  Pulse: 69 69 65 (!) 59  Resp: 18 18 18 18   Temp: 98.4 F (36.9 C) 97.8 F (36.6 C) 98.3 F (36.8 C) 98.5 F (36.9 C)  TempSrc: Oral Oral Oral Oral  SpO2: 100% 98% 99% 99%  Weight:      Height:       General: alert, cooperative and no distress Lochia: appropriate, rubra, small Uterine Fundus: firm, U/1 Incision: N/A DVT Evaluation: No evidence of DVT seen on physical exam.  Labs: Lab Results  Component Value Date   WBC 17.3 (H) 01/05/2017   HGB 11.2 (L) 01/05/2017   HCT 32.0 (L) 01/05/2017   MCV 91.2 01/05/2017   PLT 191 01/05/2017     Discharge instruction: per After Visit Summary.  Medications:  Allergies as of 01/05/2017   No Known Allergies     Medication List    STOP taking these medications   ferrous sulfate 325 (65 FE) MG EC tablet     TAKE these medications   ibuprofen 600 MG tablet Commonly known as:  ADVIL,MOTRIN Take 1 tablet (600 mg total) by mouth every 6 (six) hours.   multivitamin-prenatal 27-0.8 MG Tabs tablet Take 1 tablet by mouth daily at 12 noon.            Discharge Care Instructions        Start     Ordered   01/05/17 0000  ibuprofen (ADVIL,MOTRIN) 600 MG tablet  Every 6 hours    Question:  Supervising Provider  Answer:  Malachy Mood   01/05/17 0933   01/05/17 0000  Call MD for:    Comments:  For heavy vaginal bleeding greater than 1 pad an hour   01/05/17 0933   01/05/17 0000  Call MD for:  temperature >100.4     01/05/17 0933   01/05/17 0000  Call MD for:  persistant nausea and vomiting     01/05/17 0933   01/05/17 0000  Call MD for:  severe uncontrolled pain     01/05/17 0933   01/05/17 0000  Call MD for:  difficulty breathing, headache or visual disturbances     01/05/17 0933   01/05/17 0000  Call MD for:  hives     01/05/17 0933   01/05/17 0000  Call MD for:  persistant dizziness or light-headedness     01/05/17 0933   01/05/17 0000  Call MD for:  extreme fatigue     01/05/17 0933   01/05/17 0000  Activity as tolerated     01/05/17 0933   01/05/17 0000  Sexual acrtivity    Comments:  No intercourse, tampons, or anything vaginally for 6 weeks   01/05/17 0933   01/05/17 0000  Diet general     01/05/17 0933      Diet: routine diet  Activity: Advance as tolerated. Pelvic rest for 6 weeks.   Outpatient follow up: [facility] Department Go to postpartum mood check appointment in 2 weeks    Postpartum contraception: Depo Provera Rhogam Given postpartum: N/A Rubella vaccine given postpartum: N/A Varicella vaccine given postpartum:  N/A TDaP given antepartum or postpartum: AP  Newborn Data: Live born female: Karmen Birth Weight: 3190 g   APGAR: 7, 9   Baby Feeding: Bottle and Breast  Disposition:home with mother  SIGNED:  Rexene Agent, CNM 01/05/2017 9:35 AM

## 2017-01-04 NOTE — Progress Notes (Signed)
Dr. Kenton Kingfisher notified of pt being on the unit.  SVE, contractions, FHR. Orders given at this time.

## 2017-01-05 LAB — RPR: RPR Ser Ql: NONREACTIVE

## 2017-01-05 LAB — CBC
HCT: 32 % — ABNORMAL LOW (ref 35.0–47.0)
HEMOGLOBIN: 11.2 g/dL — AB (ref 12.0–16.0)
MCH: 31.8 pg (ref 26.0–34.0)
MCHC: 34.9 g/dL (ref 32.0–36.0)
MCV: 91.2 fL (ref 80.0–100.0)
Platelets: 191 10*3/uL (ref 150–440)
RBC: 3.51 MIL/uL — AB (ref 3.80–5.20)
RDW: 15 % — ABNORMAL HIGH (ref 11.5–14.5)
WBC: 17.3 10*3/uL — ABNORMAL HIGH (ref 3.6–11.0)

## 2017-01-05 MED ORDER — IBUPROFEN 600 MG PO TABS: 600.0000 mg | ORAL_TABLET | Freq: Four times a day (QID) | ORAL | 0 refills | Status: DC

## 2017-01-05 NOTE — Progress Notes (Signed)
  Subjective:  Ambulating in room, denies pain. Voiding without difficulty. Tolerating regular diet.  Objective:   Blood pressure (!) 101/54, pulse (!) 59, temperature 98.5 F (36.9 C), temperature source Oral, resp. rate 18, height 5' 5.5" (1.664 m), weight 156 lb (70.8 kg), last menstrual period 03/27/2016, SpO2 99 %.  General: NAD Pulmonary: no increased work of breathing Abdomen: non-distended, non-tender Uterus: fundus firm at U/1 Lochia: rubra, small Extremities: no edema, no erythema, no tenderness  Results for orders placed or performed during the hospital encounter of 01/04/17 (from the past 72 hour(s))  CBC     Status: Abnormal   Collection Time: 01/04/17  5:20 AM  Result Value Ref Range   WBC 16.9 (H) 3.6 - 11.0 K/uL   RBC 3.75 (L) 3.80 - 5.20 MIL/uL   Hemoglobin 11.7 (L) 12.0 - 16.0 g/dL   HCT 34.0 (L) 35.0 - 47.0 %   MCV 90.6 80.0 - 100.0 fL   MCH 31.2 26.0 - 34.0 pg   MCHC 34.5 32.0 - 36.0 g/dL   RDW 14.7 (H) 11.5 - 14.5 %   Platelets 207 150 - 440 K/uL  Type and screen [place]     Status: None   Collection Time: 01/04/17  5:20 AM  Result Value Ref Range   ABO/RH(D) O POS    Antibody Screen NEG    Sample Expiration 01/07/2017   RPR     Status: None   Collection Time: 01/04/17  5:20 AM  Result Value Ref Range   RPR Ser Ql Non Reactive Non Reactive    Comment: (NOTE) Performed At: [facility] [place], [place] [id number] Lindon Romp MD [place]:[id number]   CBC     Status: Abnormal   Collection Time: 01/05/17  5:27 AM  Result Value Ref Range   WBC 17.3 (H) 3.6 - 11.0 K/uL   RBC 3.51 (L) 3.80 - 5.20 MIL/uL   Hemoglobin 11.2 (L) 12.0 - 16.0 g/dL   HCT 32.0 (L) 35.0 - 47.0 %   MCV 91.2 80.0 - 100.0 fL   MCH 31.8 26.0 - 34.0 pg   MCHC 34.9 32.0 - 36.0 g/dL   RDW 15.0 (H) 11.5 - 14.5 %   Platelets 191 150 - 440 K/uL    Assessment:   26 y.o. G3P1011 postpartum day # 1  Plan:   1) Blood Type --/--/O POS  (08/24 0520) / Rubella immune   / Varicella immune  2) TDAP status: given antepartum  3) Breast and Bottle feeding/Contraception: Depo Provera  4) Disposition: Discharge home pending baby discharge.  [name], CNM

## 2017-01-05 NOTE — Progress Notes (Signed)
Pt discharged home with infant.  Discharge instructions and follow up appointment given to and reviewed with pt.  Pt verbalized understanding.  Escorted by auxillary.

## 2017-04-07 IMAGING — US US MFM OB DETAIL+14 WK
1 series · 12 of 28 positions shown · non-contrast
Comparison: none

PATIENT INFO:

PERFORMED BY:
PADRUTT
SERVICE(S) PROVIDED:
INDICATIONS:
19 weeks gestation of pregnancy
Anatomy scan
FETAL EVALUATION:
Num Of Fetuses:     1
Fetal Heart         152
Rate(bpm):
Presentation:       Cephalic
Placenta:           Anterior Grade 0, No previa
BIOMETRY:
BPD:      45.1  mm     G. Age:  19w 4d         40  %    CI:        74.38   %    70 - 86
FL/HC:       18.0  %    16.8 -
HC:       166   mm     G. Age:  19w 2d         19  %    HC/AC:       1.15       1.09 -
AC:      144.5  mm     G. Age:  19w 5d         42  %    FL/BPD:      66.1  %
FL:       29.8  mm     G. Age:  19w 2d         21  %    FL/AC:       20.6  %    20 - 24
HUM:      29.2  mm     G. Age:  19w 4d         44  %
CER:      20.9  mm     G. Age:  19w 6d         51  %
NFT:       1.8  mm
CM:        6.8  mm
Est. FW:     296   gm   0 lb 10 oz      30  %
GESTATIONAL AGE:
LMP:           19w 6d        Date:  03/27/16                 EDD:   01/01/17
U/S Today:     19w 3d                                        EDD:   01/04/17
Best:          19w 6d     Det. By:  LMP  (03/27/16)          EDD:   01/01/17
ANATOMY:
Cranium:               Within Normal Limits   Aortic Arch:            Normal appearance
Cavum:                 CSP visualized         Ductal Arch:            Normal appearance
Ventricles:            Normal appearance      Diaphragm:              Within Normal Limits
Cerebellum:            Within Normal Limits   Stomach:                Seen
Posterior Fossa:       Within Normal Limits   Abdomen:                Within Normal
Limits
Nuchal Fold:           Within Normal Limits   Abdominal Wall:         Normal appearance
Face:                  Orbits visualized      Cord Vessels:           3 vessels
Lips:                  Normal appearance      Kidneys:                Normal appearance
Thoracic:              Within Normal Limits   Bladder:                Seen
Heart:                 4-Chamber view         Spine:                  Normal appearance
appears normal
RVOT:                  Normal appearance      Upper Extremities:      Visualized
LVOT:                  Normal appearance      Lower Extremities:      Visualized
CERVIX UTERUS ADNEXA:
Cervix
Length:           4.77  cm.

[Series 1: us mfm ob detail+14 wk · 0.20mm/px · 12 of 98 slices shown]
[im 4/98]
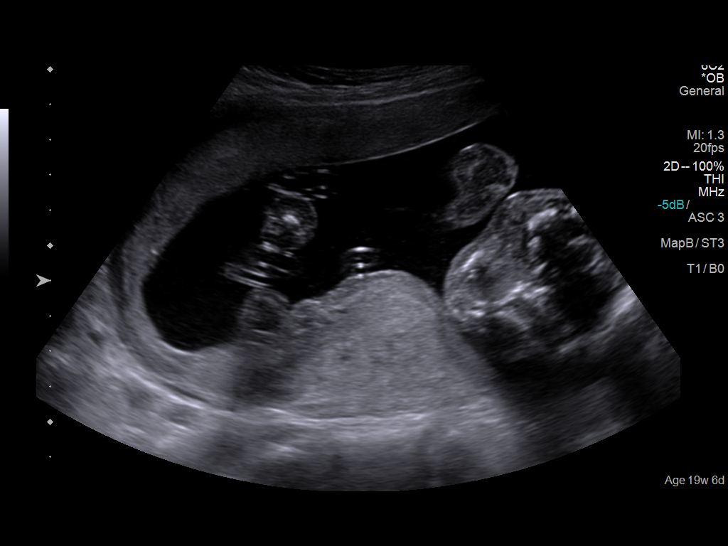
[im 11/98]
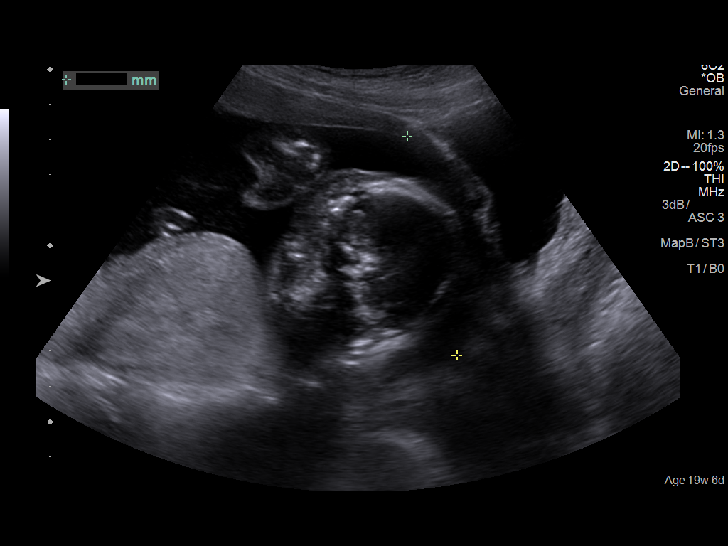
[im 18/98]
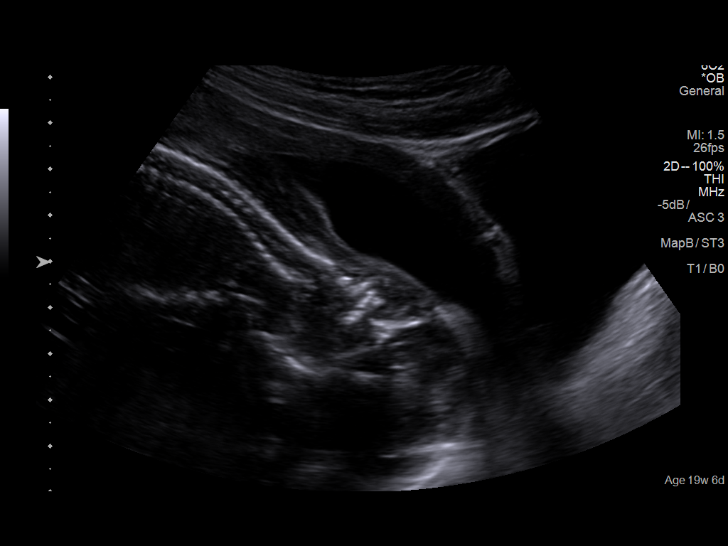
[im 29/98]
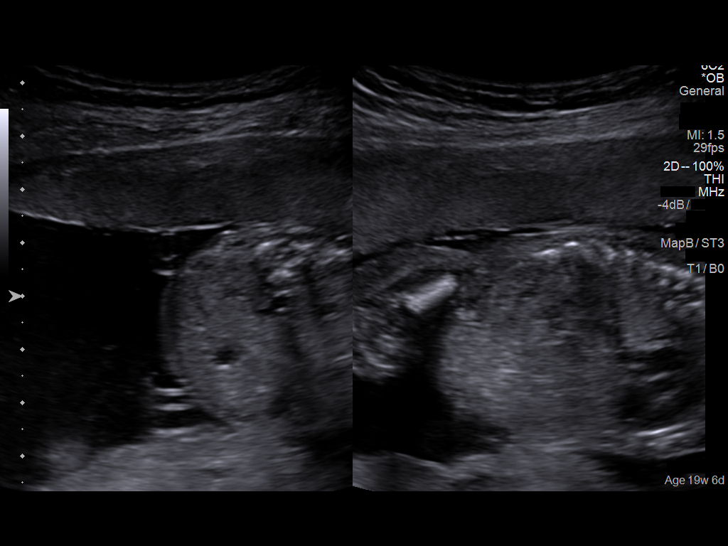
[im 36/98]
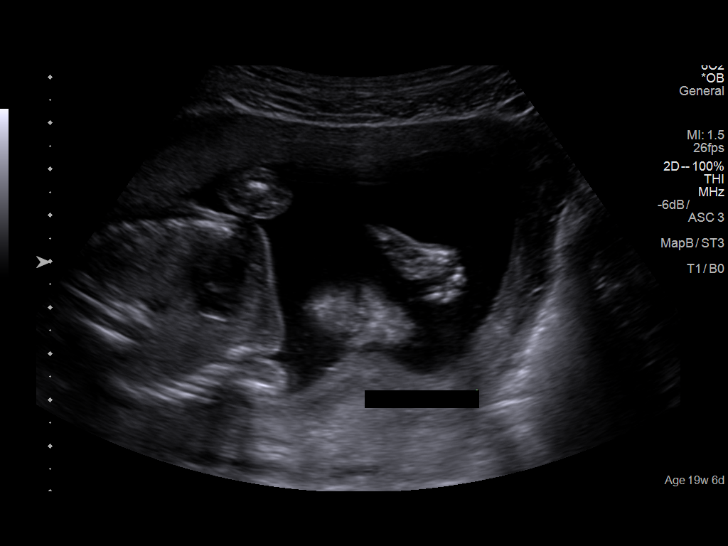
[im 44/98]
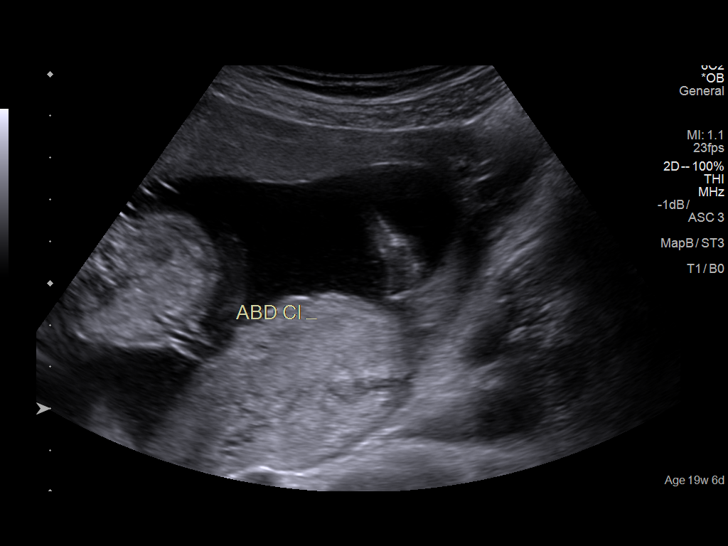
[im 54/98]
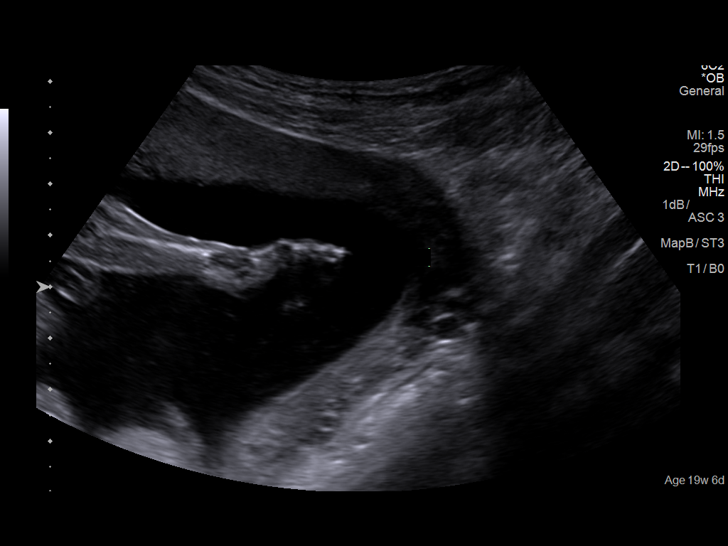
[im 62/98]
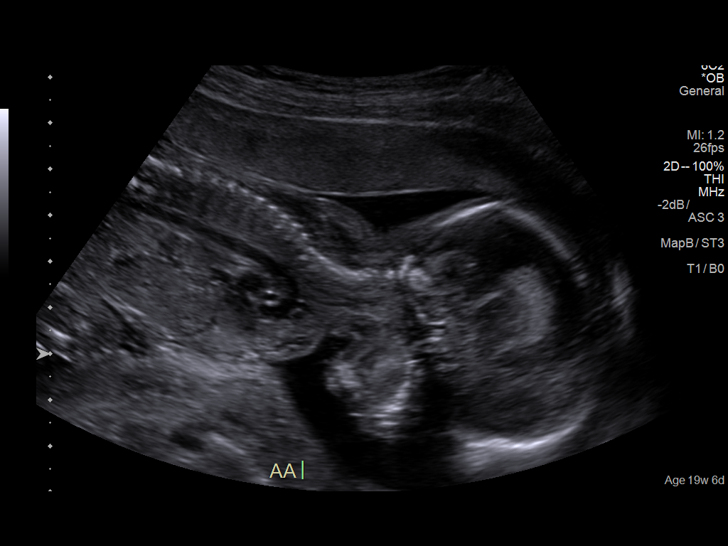
[im 69/98]
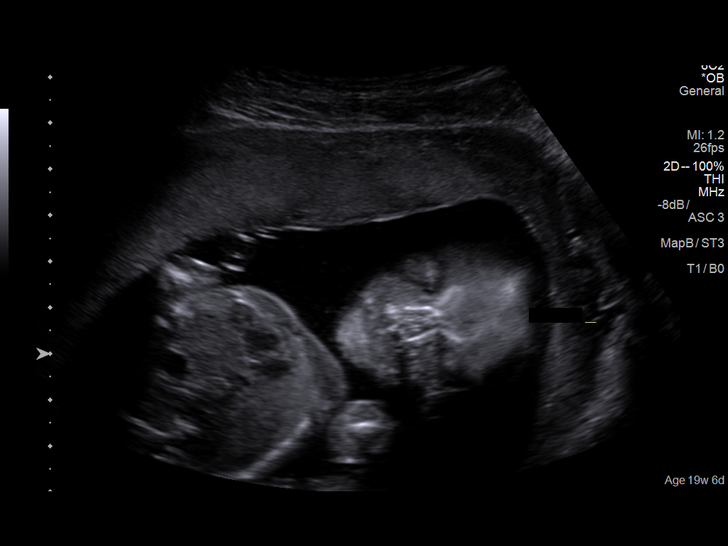
[im 80/98]
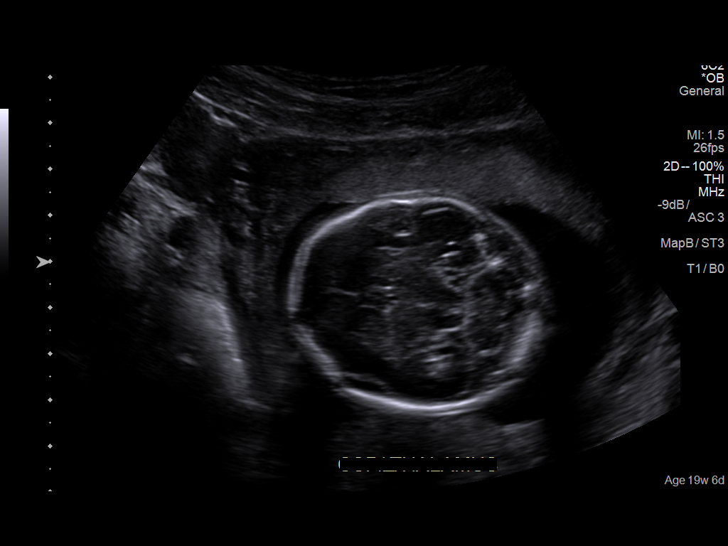
[im 87/98]
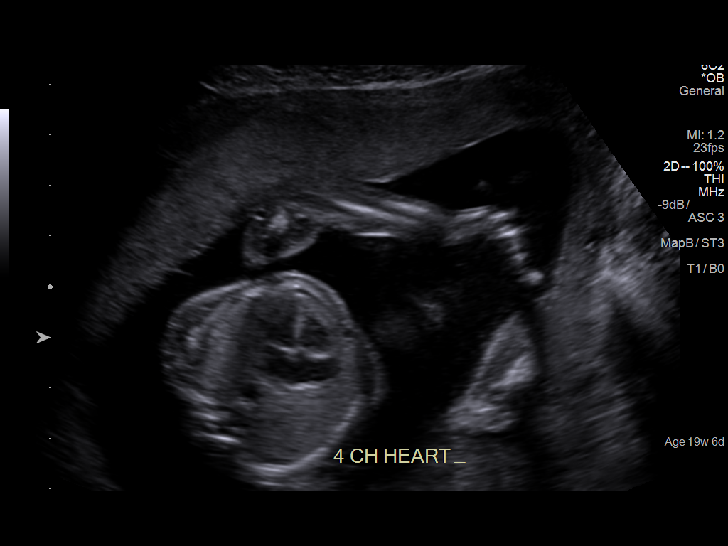
[im 94/98]
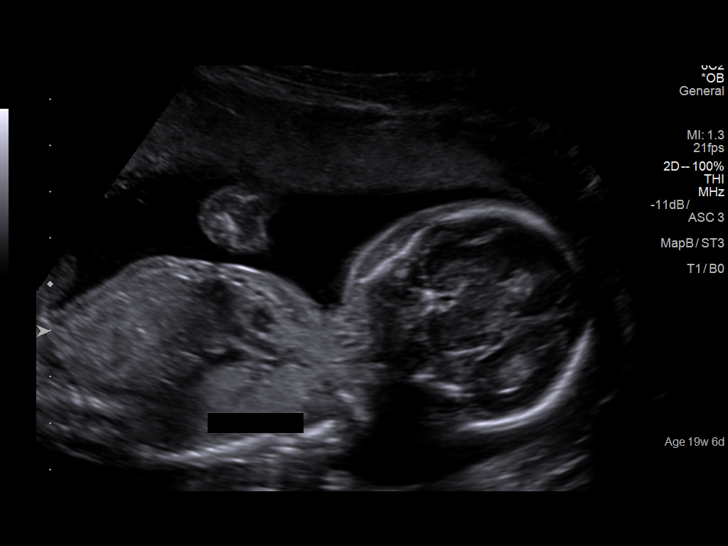

[12 of 28 positions shown; findings below may reference images not displayed]

IMPRESSION: Dear Ms.   PADRUTT,

Thank you for referring your patient  for a fetal anatomical
survey. she is a patient at [facility]. Last [REDACTED] patient was
admitted for anxiety and depression. She reports doing well
today and is on no medicaitons.

There is a singleton gestation with subjectively normal
amniotic fluid volume.

The fetal biometry correlates with established dating. She is
currently 19w 6d based on LMP and early scan on 06/28/16 at
40w6d done at [facility] First trimester screen
was performed then and was normal.

Detailed evaluation of the fetal anatomy was performed.  The
fetal anatomical survey appears within normal limits within
the resolution of ultrasound as described above.

It must be noted that a normal ultrasound cannot rule out
aneuploidy.

Thank you for allowing us to participate in your patient's care.

assistance.

## 2018-02-06 ENCOUNTER — Encounter: Payer: Self-pay | Admitting: Obstetrics and Gynecology

## 2018-02-06 ENCOUNTER — Ambulatory Visit (INDEPENDENT_AMBULATORY_CARE_PROVIDER_SITE_OTHER): Payer: Medicaid Other | Admitting: Obstetrics and Gynecology

## 2018-02-06 VITALS — BP 108/58 | Ht 65.0 in | Wt 131.0 lb

## 2018-02-06 DIAGNOSIS — O219 Vomiting of pregnancy, unspecified: Secondary | ICD-10-CM

## 2018-02-06 DIAGNOSIS — Z3A11 11 weeks gestation of pregnancy: Secondary | ICD-10-CM

## 2018-02-06 DIAGNOSIS — Z124 Encounter for screening for malignant neoplasm of cervix: Secondary | ICD-10-CM

## 2018-02-06 DIAGNOSIS — Z3480 Encounter for supervision of other normal pregnancy, unspecified trimester: Secondary | ICD-10-CM

## 2018-02-06 MED ORDER — DOXYLAMINE-PYRIDOXINE 10-10 MG PO TBEC: 2.0000 | DELAYED_RELEASE_TABLET | Freq: Every day | ORAL | 5 refills | Status: DC

## 2018-02-06 MED ORDER — CITRANATAL BLOOM DHA 90-1 & 300 MG PO MISC: 1.0000 | Freq: Every day | ORAL | 11 refills | Status: DC

## 2018-02-06 NOTE — Progress Notes (Signed)
02/06/2018   Chief Complaint: Missed period  Transfer of Care Patient: no  History of Present Illness: Toni Blake is a 27 y.o. [id number] [redacted]w[redacted]d based on Patient's last menstrual period was 11/18/2017 (within days). with an Estimated Date of Delivery: 08/25/18, with the above CC.   Her periods were: regular periods every 28 days She was using no method when she conceived.  She has positive signs or symptoms of nausea/vomiting of pregnancy. She has Negative signs or symptoms of miscarriage or preterm labor She identifies Negative Zika risk factors for her and her partner On any different medications around the time she conceived/early pregnancy: No    ROS: A 12-point review of systems was performed and negative, except as stated in the above HPI.  OBGYN History: As per HPI. OB History  Gravida Para Term Preterm AB Living  4 2 2   1 2   SAB TAB Ectopic Multiple Live Births  1       2    # Outcome Date GA Lbr Len/2nd Weight Sex Delivery Anes PTL Lv  4 Current           3 Term 01/04/17 [redacted]w[redacted]d  7 lb 3 oz (3.26 kg) F Vag-Spont   LIV  2 Term 07/28/11 [redacted]w[redacted]d  7 lb 3 oz (3.26 kg) F Vag-Spont   LIV     Complications: Oligohydramnios  1 SAB      SAB       Any issues with any prior pregnancies: no Any prior children are healthy, doing well, without any problems or issues: yes History of pap smears: No. Last pap smear unknown History of STIs: No   Past Medical History: Past Medical History:  Diagnosis Date  . Anemia   . Anxiety   . Depression   . Migraine     Past Surgical History: Past Surgical History:  Procedure Laterality Date  . 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 Depression Mother   . Migraines Mother   . Asthma Father   . Depression Sister   . Diabetes Paternal Grandmother    She denies any female cancers, bleeding or blood clotting disorders.  She denies any history of mental  retardation, birth defects or genetic disorders in her or the FOB's history  Social History:  Social History   Socioeconomic History  . Marital status: Single    Spouse name: Not on file  . Number of children: 1  . Years of education: Not on file  . Highest education level: Not on file  Occupational History  . Not on file  Social Needs  . Financial resource strain: Not on file  . Food insecurity:    Worry: Not on file    Inability: Not on file  . Transportation needs:    Medical: Not on file    Non-medical: Not on file  Tobacco Use  . Smoking status: Current Every Day Smoker    Packs/day: 1.00    Types: Cigarettes  . Smokeless tobacco: Never Used  Substance and Sexual Activity  . Alcohol use: No  . Drug use: No    Comment: Past use of MJ and cocaine  . Sexual activity: Yes    Partners: Male    Birth control/protection: Condom  Lifestyle  . Physical activity:    Days per week: Not on file    Minutes per session: Not on file  . Stress: Not  on file  Relationships  . Social connections:    Talks on phone: Not on file    Gets together: Not on file    Attends religious service: Not on file    Active member of club or organization: Not on file    Attends meetings of clubs or organizations: Not on file    Relationship status: Not on file  . Intimate partner violence:    Fear of current or ex partner: Not on file    Emotionally abused: Not on file    Physically abused: Not on file    Forced sexual activity: Not on file  Other Topics Concern  . Not on file  Social History Narrative  . Not on file   Any pets in the household: yes Has Cats, she does not changes cat litter and understands the risks  Allergy: No Known Allergies  Current Outpatient Medications:  Current Outpatient Medications:  .  Prenatal Vit-Fe Fumarate-FA (MULTIVITAMIN-PRENATAL) 27-0.8 MG TABS tablet, Take 1 tablet by mouth daily at 12 noon., Disp: , Rfl:  .  ibuprofen (ADVIL,MOTRIN) 600 MG  tablet, Take 1 tablet (600 mg total) by mouth every 6 (six) hours. (Patient not taking: Reported on 02/06/2018), Disp: 30 tablet, Rfl: 0 .  Prenat w/o A-FeCbGl-DSS-FA-DHA (CITRANATAL BLOOM DHA) 90-1 & 300 MG MISC, Take 1 tablet by mouth daily., Disp: 30 each, Rfl: 11   Physical Exam:   BP (!) 108/58   Wt 131 lb (59.4 kg)   LMP 11/18/2017 (Within Days)   BMI 21.47 kg/m  Body mass index is 21.47 kg/m. Constitutional: Well nourished, well developed female in no acute distress.  Neck:  Supple, normal appearance, and no thyromegaly  Cardiovascular: S1, S2 normal, no murmur, rub or gallop, regular rate and rhythm Respiratory:  Clear to auscultation bilateral. Normal respiratory effort Abdomen: positive bowel sounds and no masses, hernias; diffusely non tender to palpation, non distended Breasts: breasts appear normal, no suspicious masses, no skin or nipple changes or axillary nodes, patient declines to have breast exam. Neuro/Psych:  Normal mood and affect.  Skin:  Warm and dry.  Lymphatic:  No inguinal lymphadenopathy.   Pelvic exam: Declined today, will return without her infant to have a pelvic exam  [name] is a 27 y.o. [id number] [redacted]w[redacted]d based on Patient's last menstrual period was 11/18/2017 (within days). with an Estimated Date of Delivery: 08/25/18,  for prenatal care.  Plan:  1) Avoid alcoholic beverages. 2) Patient encouraged not to smoke.  3) Discontinue the use of all non-medicinal drugs and chemicals.  4) Take prenatal vitamins daily.  5) Seatbelt use advised 6) Nutrition, food safety (fish, cheese advisories, and high nitrite foods) and exercise discussed. 7) Hospital and practice style delivering at [facility] discussed  8) Patient is asked about travel to areas at risk for the [place] virus, and counseled to avoid travel and exposure to mosquitoes or sexual partners who may have themselves been exposed to the virus. Testing is discussed, and will be ordered as  appropriate.  9) Childbirth classes at [facility] advised 10) Genetic Screening, such as with 1st Trimester Screening, cell free fetal DNA, AFP testing, and Ultrasound, as well as with amniocentesis and CVS as appropriate, is discussed with patient. She plans to have genetic testing this pregnancy.  Declines pap smear today. Will have at next visit when she does not have her child with her.  Desires first trimester screening, does not want to know the infant's gender.  Return in 1  week for [place] and ROB, complete papa and GC/CT at that time.   Problem list reviewed and updated.  Adrian Prows MD [facility] 02/06/18 11:46 AM

## 2018-02-06 NOTE — Progress Notes (Signed)
NOB C/o nausea  Declines flu shot

## 2018-02-07 LAB — RPR+RH+ABO+RUB AB+AB SCR+CB...
ANTIBODY SCREEN: NEGATIVE
HEMOGLOBIN: 12 g/dL (ref 11.1–15.9)
HIV Screen 4th Generation wRfx: NONREACTIVE
Hematocrit: 36.1 % (ref 34.0–46.6)
Hepatitis B Surface Ag: NEGATIVE
MCH: 29.2 pg (ref 26.6–33.0)
MCHC: 33.2 g/dL (ref 31.5–35.7)
MCV: 88 fL (ref 79–97)
Platelets: 231 10*3/uL (ref 150–450)
RBC: 4.11 x10E6/uL (ref 3.77–5.28)
RDW: 12.4 % (ref 12.3–15.4)
RPR: NONREACTIVE
Rh Factor: POSITIVE
Rubella Antibodies, IGG: <0.9 index — ABNORMAL LOW (ref >0.99)
VARICELLA: 640 {index} (ref >165)
WBC: 11.1 10*3/uL — AB (ref 3.4–10.8)

## 2018-02-07 LAB — URINE DRUG PANEL 7
Amphetamines, Urine: NEGATIVE ng/mL
Barbiturate Quant, Ur: NEGATIVE ng/mL
Benzodiazepine Quant, Ur: NEGATIVE ng/mL
Cannabinoid Quant, Ur: NEGATIVE ng/mL
Cocaine (Metab.): NEGATIVE ng/mL
OPIATE QUANT UR: NEGATIVE ng/mL
PCP QUANT UR: NEGATIVE ng/mL

## 2018-02-08 LAB — URINE CULTURE

## 2018-02-13 ENCOUNTER — Ambulatory Visit (INDEPENDENT_AMBULATORY_CARE_PROVIDER_SITE_OTHER): Payer: Self-pay | Admitting: Maternal Newborn

## 2018-02-13 ENCOUNTER — Ambulatory Visit (INDEPENDENT_AMBULATORY_CARE_PROVIDER_SITE_OTHER): Payer: Self-pay

## 2018-02-13 ENCOUNTER — Other Ambulatory Visit (HOSPITAL_COMMUNITY)
Admission: RE | Admit: 2018-02-13 | Discharge: 2018-02-13 | Disposition: A | Discharge status: Home / Self Care | Payer: Medicaid Other | Source: Ambulatory Visit | Attending: Maternal Newborn | Admitting: Maternal Newborn

## 2018-02-13 VITALS — BP 102/58 | Wt 130.5 lb

## 2018-02-13 DIAGNOSIS — O3411 Maternal care for benign tumor of corpus uteri, first trimester: Secondary | ICD-10-CM

## 2018-02-13 DIAGNOSIS — Z3A10 10 weeks gestation of pregnancy: Secondary | ICD-10-CM

## 2018-02-13 DIAGNOSIS — Z3A12 12 weeks gestation of pregnancy: Secondary | ICD-10-CM

## 2018-02-13 DIAGNOSIS — Z3480 Encounter for supervision of other normal pregnancy, unspecified trimester: Secondary | ICD-10-CM

## 2018-02-13 DIAGNOSIS — Z124 Encounter for screening for malignant neoplasm of cervix: Secondary | ICD-10-CM | POA: Diagnosis not present

## 2018-02-13 DIAGNOSIS — Z113 Encounter for screening for infections with a predominantly sexual mode of transmission: Secondary | ICD-10-CM | POA: Insufficient documentation

## 2018-02-13 DIAGNOSIS — N8312 Corpus luteum cyst of left ovary: Secondary | ICD-10-CM

## 2018-02-13 DIAGNOSIS — Z3481 Encounter for supervision of other normal pregnancy, first trimester: Secondary | ICD-10-CM

## 2018-02-13 LAB — POCT URINALYSIS DIPSTICK OB
Glucose, UA: NEGATIVE
PROTEIN: NEGATIVE

## 2018-02-13 NOTE — Progress Notes (Signed)
    Routine Prenatal Care Visit  Subjective  Toni Blake is a 27 y.o. [phone] at [redacted]w[redacted]d by LMP being seen today for ongoing prenatal care.  She is currently monitored for the following issues for this low-risk pregnancy and has Adjustment disorder with depressed mood; Suicidal ideation; Migraine; Cocaine abuse ([place]); Panic disorder; Tobacco use disorder; and Supervision of other normal pregnancy, antepartum on their problem list.  ----------------------------------------------------------------------------------- Patient reports no complaints.    Vag. Bleeding: None.  No leaking of fluid.  ----------------------------------------------------------------------------------- The following portions of the patient's history were reviewed and updated as appropriate: allergies, current medications, past family history, past medical history, past social history, past surgical history and problem list. Problem list updated.   Objective  Blood pressure (!) 102/58, weight 130 lb 8 oz (59.2 kg), last menstrual period 11/18/2017. Pregravid weight 120 lb (54.4 kg) Total Weight Gain 10 lb 8 oz (4.763 kg) Body mass index is 21.72 kg/m.  Urinalysis: Protein Negative, Glucose Negative Fetal Status: Fetal Heart Rate (bpm): 176         General:  Alert, oriented and cooperative. Patient is in no acute distress.  Skin: Skin is warm and dry. No rash noted.   Cardiovascular: Normal heart rate noted  Respiratory: Normal respiratory effort, no problems with respiration noted  Abdomen: Soft, gravid, appropriate for gestational age.       Pelvic:  Cervical exam deferred        Extremities: Normal range of motion.     Mental Status: Normal mood and affect. Normal behavior. Normal judgment and thought content.     Assessment   27 y.o. [id number] at [redacted]w[redacted]d, EDD 09/07/2018 by Ultrasound presenting for a routine prenatal visit.  Plan   Pregnancy#4 Problems (from 11/18/17 to present)    Problem Noted Resolved     Supervision of other normal pregnancy, antepartum 02/06/2018 by Homero Fellers, MD No   Overview Signed 02/06/2018  1:36 PM by Homero Fellers, MD      Clinic [facility] Prenatal Labs  Dating  Blood type:     Genetic Screen 1 Screen:     AFP:      Quad:      NIPS:    Antibody:   Anatomic [place]  Rubella:   Varicella: @VZVIGG @  GTT Early:        28 wk:      RPR:     Rhogam  HBsAg:     TDaP vaccine                       HIV:     Flu Shot                                GBS:   Contraception  Pap:  CBB     CS/VBAC    Baby Food    Support Person              Pap and GC today.   Ultrasound today shows a single IUP at 10w 4d gestation, FHR 176. Dating changed according to ACOG guidelines.  Gestational age appropriate precautions were reviewed.  Return in about 4 weeks (around 03/13/2018) for ROB.  [name], CNM 02/13/2018  11:42 AM

## 2018-02-13 NOTE — Progress Notes (Signed)
ROB/U/S No concerns  Declines flu shot for entire pregnancy

## 2018-02-14 LAB — CYTOLOGY - PAP
Chlamydia: NEGATIVE
DIAGNOSIS: NEGATIVE
Neisseria Gonorrhea: NEGATIVE

## 2018-02-17 NOTE — Progress Notes (Signed)
Called and discussed with patient.  Normal except for rubella nonimmune, advised revaccination postpartum.

## 2018-02-19 ENCOUNTER — Other Ambulatory Visit: Payer: Self-pay | Admitting: Maternal Newborn

## 2018-02-19 DIAGNOSIS — B373 Candidiasis of vulva and vagina: Secondary | ICD-10-CM

## 2018-02-19 DIAGNOSIS — B3731 Acute candidiasis of vulva and vagina: Secondary | ICD-10-CM

## 2018-02-19 MED ORDER — TERCONAZOLE 0.4 % VA CREA: 1.0000 | TOPICAL_CREAM | Freq: Every day | VAGINAL | 0 refills | Status: AC

## 2018-02-19 NOTE — Progress Notes (Signed)
Rx sent for terconazole to treat yeast infection. Patient aware.

## 2018-03-01 ENCOUNTER — Other Ambulatory Visit: Payer: Self-pay | Admitting: Obstetrics and Gynecology

## 2018-03-13 ENCOUNTER — Encounter: Payer: Medicaid Other | Admitting: Obstetrics and Gynecology

## 2018-03-19 ENCOUNTER — Ambulatory Visit (INDEPENDENT_AMBULATORY_CARE_PROVIDER_SITE_OTHER): Payer: Medicaid Other | Admitting: Maternal Newborn

## 2018-03-19 VITALS — BP 98/66 | Wt 134.0 lb

## 2018-03-19 DIAGNOSIS — Z3480 Encounter for supervision of other normal pregnancy, unspecified trimester: Secondary | ICD-10-CM

## 2018-03-19 DIAGNOSIS — Z3689 Encounter for other specified antenatal screening: Secondary | ICD-10-CM

## 2018-03-19 DIAGNOSIS — Z1379 Encounter for other screening for genetic and chromosomal anomalies: Secondary | ICD-10-CM

## 2018-03-19 DIAGNOSIS — Z3A15 15 weeks gestation of pregnancy: Secondary | ICD-10-CM

## 2018-03-19 DIAGNOSIS — Z3482 Encounter for supervision of other normal pregnancy, second trimester: Secondary | ICD-10-CM

## 2018-03-19 LAB — POCT URINALYSIS DIPSTICK OB
GLUCOSE, UA: NEGATIVE
POC,PROTEIN,UA: NEGATIVE

## 2018-03-19 NOTE — Progress Notes (Signed)
    Routine Prenatal Care Visit  Subjective  Toni Blake is a 27 y.o. [phone] at [redacted]w[redacted]d being seen today for ongoing prenatal care.  She is currently monitored for the following issues for this low-risk pregnancy and has Adjustment disorder with depressed mood; Suicidal ideation; Migraine; Cocaine abuse ([place]); Panic disorder; Tobacco use disorder; and Supervision of other normal pregnancy, antepartum on their problem list.  ----------------------------------------------------------------------------------- Patient reports frequent headaches.   Vag. Bleeding: None.  ----------------------------------------------------------------------------------- The following portions of the patient's history were reviewed and updated as appropriate: allergies, current medications, past family history, past medical history, past social history, past surgical history and problem list. Problem list updated.  Objective  Blood pressure 98/66, weight 134 lb (60.8 kg), last menstrual period 11/18/2017. Pregravid weight 120 lb (54.4 kg) Total Weight Gain 14 lb (6.35 kg)  Body mass index is 22.3 kg/m.   Urinalysis: Protein Negative, Glucose Negative  Fetal Status: Fetal Heart Rate (bpm): 154         General:  Alert, oriented and cooperative. Patient is in no acute distress.  Skin: Skin is warm and dry. No rash noted.   Cardiovascular: Normal heart rate noted  Respiratory: Normal respiratory effort, no problems with respiration noted  Abdomen: Soft, gravid, appropriate for gestational age. Pain/Pressure: Absent     Pelvic:  Cervical exam deferred        Extremities: Normal range of motion.     Mental Status: Normal mood and affect. Normal behavior. Normal judgment and thought content.     Assessment   27 y.o. [id number] at [redacted]w[redacted]d, EDD 09/07/2018 by Ultrasound presenting for a routine prenatal visit.  Plan   Pregnancy#4 Problems (from 11/18/17 to present)    Problem Noted Resolved   Supervision of  other normal pregnancy, antepartum 02/06/2018 by Homero Fellers, MD No   Overview Addendum 02/19/2018  2:13 PM by Rexene Agent, [facility]  Dating  Blood type:     Genetic Screen 1 Screen:     AFP:      Quad:      NIPS:    Antibody:   Anatomic [place]  Rubella:   Varicella: @VZVIGG @  GTT Early:        28 wk:      RPR:     Rhogam  HBsAg:     TDaP vaccine                       HIV:     Flu Shot                                GBS:   Contraception  Pap: 02/13/2018, NILM  CBB     CS/VBAC    Baby Food    Support Person              Advised use of extra strength Tylenol with a caffeinated drink to help with headaches. Consider Fioricet if unrelieved with these measures.  [id number] today. Uncertain if she wants to know gender; ask when calling with results.   Gestational age appropriate obstetric precautions were reviewed.  Return in about 4 weeks (around 04/16/2018) for ROB and anatomy scan.  [name], CNM 03/19/2018  11:18 AM

## 2018-03-19 NOTE — Progress Notes (Signed)
ROB Headaches 

## 2018-03-24 ENCOUNTER — Encounter: Payer: Self-pay | Admitting: Maternal Newborn

## 2018-03-27 LAB — MATERNIT 21 PLUS CORE, BLOOD
CHROMOSOME 13: NEGATIVE
Chromosome 18: NEGATIVE
Chromosome 21: NEGATIVE
Y Chromosome: DETECTED

## 2018-04-16 ENCOUNTER — Encounter: Payer: Self-pay | Admitting: Advanced Practice Midwife

## 2018-04-16 ENCOUNTER — Ambulatory Visit (INDEPENDENT_AMBULATORY_CARE_PROVIDER_SITE_OTHER): Payer: Medicaid Other | Admitting: Advanced Practice Midwife

## 2018-04-16 ENCOUNTER — Ambulatory Visit (INDEPENDENT_AMBULATORY_CARE_PROVIDER_SITE_OTHER): Payer: Self-pay

## 2018-04-16 VITALS — BP 80/60 | Wt 137.0 lb

## 2018-04-16 DIAGNOSIS — Z3480 Encounter for supervision of other normal pregnancy, unspecified trimester: Secondary | ICD-10-CM

## 2018-04-16 DIAGNOSIS — Z363 Encounter for antenatal screening for malformations: Secondary | ICD-10-CM

## 2018-04-16 DIAGNOSIS — Z3689 Encounter for other specified antenatal screening: Secondary | ICD-10-CM

## 2018-04-16 DIAGNOSIS — Z3A19 19 weeks gestation of pregnancy: Secondary | ICD-10-CM

## 2018-04-16 DIAGNOSIS — Z3482 Encounter for supervision of other normal pregnancy, second trimester: Secondary | ICD-10-CM

## 2018-04-16 NOTE — Progress Notes (Signed)
  Routine Prenatal Care Visit  Subjective  Toni Blake is a 27 y.o. [phone] at [redacted]w[redacted]d being seen today for ongoing prenatal care.  She is currently monitored for the following issues for this low-risk pregnancy and has Adjustment disorder with depressed mood; Suicidal ideation; Migraine; Cocaine abuse ([place]); Panic disorder; Tobacco use disorder; and Supervision of other normal pregnancy, antepartum on their problem list.  ----------------------------------------------------------------------------------- Patient reports no complaints.    . Vag. Bleeding: None.  Movement: Present. Denies leaking of fluid.  ----------------------------------------------------------------------------------- The following portions of the patient's history were reviewed and updated as appropriate: allergies, current medications, past family history, past medical history, past social history, past surgical history and problem list. Problem list updated.   Objective  Blood pressure (!) 80/60, weight 137 lb (62.1 kg), last menstrual period 11/18/2017 Pregravid weight 120 lb (54.4 kg) Total Weight Gain 17 lb (7.711 kg) Urinalysis: Urine Protein    Urine Glucose    Fetal Status: Fetal Heart Rate (bpm): 151 Fundal Height: 20 cm Movement: Present     Anatomy scan today is incomplete for 4 chamber heart and outflow tracks and otherwise normal. Cephalic presentation  General:  Alert, oriented and cooperative. Patient is in no acute distress.  Skin: Skin is warm and dry. No rash noted.   Cardiovascular: Normal heart rate noted  Respiratory: Normal respiratory effort, no problems with respiration noted  Abdomen: Soft, gravid, appropriate for gestational age. Pain/Pressure: Absent     Pelvic:  Cervical exam deferred        Extremities: Normal range of motion.  Edema: None  Mental Status: Normal mood and affect. Normal behavior. Normal judgment and thought content.   Assessment   27 y.o. [id number] at [redacted]w[redacted]d by   09/07/2018, by Ultrasound presenting for routine prenatal visit  Plan   Pregnancy#4 Problems (from 11/18/17 to present)    Problem Noted Resolved   Supervision of other normal pregnancy, antepartum 02/06/2018 by Homero Fellers, MD No   Overview Addendum 02/19/2018  2:13 PM by Rexene Agent, [place] [facility]  Dating  Blood type:     Genetic Screen 1 Screen:     AFP:      Quad:      NIPS:    Antibody:   Anatomic [place]  Rubella:   Varicella: @VZVIGG @  GTT Early:        28 wk:      RPR:     Rhogam  HBsAg:     TDaP vaccine                       HIV:     Flu Shot                                GBS:   Contraception  Pap: 02/13/2018, NILM  CBB     CS/VBAC    Baby Food    Support Person                 Preterm labor symptoms and general obstetric precautions including but not limited to vaginal bleeding, contractions, leaking of fluid and fetal movement were reviewed in detail with the patient. Please refer to After Visit Summary for other counseling recommendations.   Return in about 4 weeks (around 05/14/2018) for follow up anatomy scan and rob.  Rod Can, CNM 04/16/2018 11:32 AM

## 2018-04-16 NOTE — Patient Instructions (Signed)

## 2018-05-02 ENCOUNTER — Telehealth: Payer: Self-pay

## 2018-05-02 ENCOUNTER — Other Ambulatory Visit: Payer: Self-pay | Admitting: Advanced Practice Midwife

## 2018-05-02 DIAGNOSIS — J111 Influenza due to unidentified influenza virus with other respiratory manifestations: Secondary | ICD-10-CM

## 2018-05-02 MED ORDER — OSELTAMIVIR PHOSPHATE 75 MG PO CAPS: 75.0000 mg | ORAL_CAPSULE | Freq: Two times a day (BID) | ORAL | 0 refills | Status: AC

## 2018-05-02 NOTE — Telephone Encounter (Signed)
Pt aware.

## 2018-05-02 NOTE — Telephone Encounter (Signed)
Pt was diagnosed c the flu yesterday at Urgent Care and was told to call us.  Does she need appt or rx?  [phone]

## 2018-05-02 NOTE — Progress Notes (Signed)
Rx Tamiflu sent to patient pharmacy. She was diagnosed with flu yesterday at urgent care.

## 2018-05-02 NOTE — Telephone Encounter (Signed)
She doesn't need an appointment, but I will send Tamiflu to her pharmacy and she also needs to stay hydrated and get plenty of rest, tylenol for pain/fever. Thanks for letting her know.

## 2018-05-14 NOTE — L&D Delivery Note (Signed)
Obstetrical Delivery Note   Date of Delivery:   08/30/2018 Primary OB:   [facility] Gestational Age/EDD: [redacted]w[redacted]d (Dated by 10wkultrasound) Antepartum complications: tobacco use  Delivered By:   Dalia Heading, CNM  Delivery Type:   spontaneous vaginal delivery  Procedure Details:   Called to see mother with rectal pressure and urge to push. Able to eventually reduce anterior lip and mother pushed to deliver a vigorous female infant in LOA over intact perineum. Apgars 9/9 Baby dried and placed on mother's abdomen. After a delayed cord clamping, the clord was cut by the FOB Sean. Baby then placed skin to skin with mother. Spontaneous delivery of intact placenta and 3 vessel cord. No perineal, vaginal or cervical lacerations. One labial abrasion inner upper left labia not needing repair. Anesthesia:    none Intrapartum complications: None GBS:    negative Laceration:    none Episiotomy:    none Placenta:    Via active 3rd stage. To pathology: no Estimated Blood Loss:  350 ml Baby:    Liveborn female, Apgars 9/9, weight pending    Dalia Heading, CNM

## 2018-05-15 ENCOUNTER — Ambulatory Visit (INDEPENDENT_AMBULATORY_CARE_PROVIDER_SITE_OTHER): Payer: Medicaid Other

## 2018-05-15 ENCOUNTER — Encounter: Payer: Self-pay | Admitting: Advanced Practice Midwife

## 2018-05-15 ENCOUNTER — Ambulatory Visit (INDEPENDENT_AMBULATORY_CARE_PROVIDER_SITE_OTHER): Payer: Medicaid Other | Admitting: Advanced Practice Midwife

## 2018-05-15 VITALS — BP 78/40 | Wt 140.0 lb

## 2018-05-15 DIAGNOSIS — Z130 Encounter for screening for diseases of the blood and blood-forming organs and certain disorders involving the immune mechanism: Secondary | ICD-10-CM

## 2018-05-15 DIAGNOSIS — Z3482 Encounter for supervision of other normal pregnancy, second trimester: Secondary | ICD-10-CM

## 2018-05-15 DIAGNOSIS — Z362 Encounter for other antenatal screening follow-up: Secondary | ICD-10-CM

## 2018-05-15 DIAGNOSIS — Z113 Encounter for screening for infections with a predominantly sexual mode of transmission: Secondary | ICD-10-CM

## 2018-05-15 DIAGNOSIS — Z3A23 23 weeks gestation of pregnancy: Secondary | ICD-10-CM

## 2018-05-15 DIAGNOSIS — Z3480 Encounter for supervision of other normal pregnancy, unspecified trimester: Secondary | ICD-10-CM

## 2018-05-15 DIAGNOSIS — Z131 Encounter for screening for diabetes mellitus: Secondary | ICD-10-CM

## 2018-05-15 LAB — POCT URINALYSIS DIPSTICK OB
GLUCOSE, UA: NEGATIVE
POC,PROTEIN,UA: NEGATIVE

## 2018-05-15 NOTE — Progress Notes (Signed)
  Routine Prenatal Care Visit  Subjective  Toni Blake is a 28 y.o. [phone] at [redacted]w[redacted]d being seen today for ongoing prenatal care.  She is currently monitored for the following issues for this low-risk pregnancy and has Adjustment disorder with depressed mood; Suicidal ideation; Migraine; Cocaine abuse ([place]); Panic disorder; Tobacco use disorder; and Supervision of other normal pregnancy, antepartum on their problem list.  ----------------------------------------------------------------------------------- Patient reports she is feeling better now since having the flu.    . Vag. Bleeding: None.  Movement: Present. Denies leaking of fluid.  ----------------------------------------------------------------------------------- The following portions of the patient's history were reviewed and updated as appropriate: allergies, current medications, past family history, past medical history, past social history, past surgical history and problem list. Problem list updated.   Objective  Blood pressure (!) 78/40, weight 140 lb (63.5 kg), last menstrual period 11/18/2017 Pregravid weight 120 lb (54.4 kg) Total Weight Gain 20 lb (9.072 kg) Urinalysis: Urine Protein    Urine Glucose    Fetal Status: Fetal Heart Rate (bpm): 156 Fundal Height: 24 cm Movement: Present     Anatomy is now complete  General:  Alert, oriented and cooperative. Patient is in no acute distress.  Skin: Skin is warm and dry. No rash noted.   Cardiovascular: Normal heart rate noted  Respiratory: Normal respiratory effort, no problems with respiration noted  Abdomen: Soft, gravid, appropriate for gestational age. Pain/Pressure: Absent     Pelvic:  Cervical exam deferred        Extremities: Normal range of motion.  Edema: None  Mental Status: Normal mood and affect. Normal behavior. Normal judgment and thought content.   Assessment   28 y.o. [id number] at [redacted]w[redacted]d by  09/07/2018, by Ultrasound presenting for routine prenatal  visit  Plan   Pregnancy#4 Problems (from 11/18/17 to present)    Problem Noted Resolved   Supervision of other normal pregnancy, antepartum 02/06/2018 by Homero Fellers, MD No   Overview Addendum 02/19/2018  2:13 PM by Rexene Agent, [place] Prenatal Labs  Dating  Blood type:     Genetic Screen 1 Screen:     AFP:      Quad:      NIPS:    Antibody:   Anatomic [place]  Rubella:   Varicella: @VZVIGG @  GTT Early:        28 wk:      RPR:     Rhogam  HBsAg:     TDaP vaccine                       HIV:     Flu Shot                                GBS:   Contraception  Pap: 02/13/2018, NILM  CBB     CS/VBAC    Baby Food    Support Person                 Preterm labor symptoms and general obstetric precautions including but not limited to vaginal bleeding, contractions, leaking of fluid and fetal movement were reviewed in detail with the patient.   Return in about 4 weeks (around 06/12/2018) for 28 wk labs and rob.  Rod Can, CNM 05/15/2018 10:45 AM

## 2018-06-12 ENCOUNTER — Other Ambulatory Visit: Payer: Medicaid Other

## 2018-06-12 ENCOUNTER — Encounter: Payer: Self-pay | Admitting: Advanced Practice Midwife

## 2018-06-12 ENCOUNTER — Ambulatory Visit (INDEPENDENT_AMBULATORY_CARE_PROVIDER_SITE_OTHER): Payer: Medicaid Other | Admitting: Advanced Practice Midwife

## 2018-06-12 VITALS — BP 100/72 | Wt 148.0 lb

## 2018-06-12 DIAGNOSIS — Z3A27 27 weeks gestation of pregnancy: Secondary | ICD-10-CM

## 2018-06-12 DIAGNOSIS — F1410 Cocaine abuse, uncomplicated: Secondary | ICD-10-CM

## 2018-06-12 DIAGNOSIS — O99322 Drug use complicating pregnancy, second trimester: Secondary | ICD-10-CM

## 2018-06-12 DIAGNOSIS — Z3480 Encounter for supervision of other normal pregnancy, unspecified trimester: Secondary | ICD-10-CM

## 2018-06-12 DIAGNOSIS — Z131 Encounter for screening for diabetes mellitus: Secondary | ICD-10-CM

## 2018-06-12 DIAGNOSIS — Z113 Encounter for screening for infections with a predominantly sexual mode of transmission: Secondary | ICD-10-CM

## 2018-06-12 DIAGNOSIS — Z130 Encounter for screening for diseases of the blood and blood-forming organs and certain disorders involving the immune mechanism: Secondary | ICD-10-CM

## 2018-06-12 NOTE — Progress Notes (Signed)
28 week labs today. No vb. No lof.  

## 2018-06-12 NOTE — Patient Instructions (Signed)
Third Trimester of Pregnancy The third trimester is from week 28 through week 40 (months 7 through 9). The third trimester is a time when the unborn baby (fetus) is growing rapidly. At the end of the ninth month, the fetus is about 20 inches in length and weighs 6-10 pounds. Body changes during your third trimester Your body will continue to go through many changes during pregnancy. The changes vary from woman to woman. During the third trimester:  Your weight will continue to increase. You can expect to gain 25-35 pounds (11-16 kg) by the end of the pregnancy.  You may begin to get stretch marks on your hips, abdomen, and breasts.  You may urinate more often because the fetus is moving lower into your pelvis and pressing on your bladder.  You may develop or continue to have heartburn. This is caused by increased hormones that slow down muscles in the digestive tract.  You may develop or continue to have constipation because increased hormones slow digestion and cause the muscles that push waste through your intestines to relax.  You may develop hemorrhoids. These are swollen veins (varicose veins) in the rectum that can itch or be painful.  You may develop swollen, bulging veins (varicose veins) in your legs.  You may have increased body aches in the pelvis, back, or thighs. This is due to weight gain and increased hormones that are relaxing your joints.  You may have changes in your hair. These can include thickening of your hair, rapid growth, and changes in texture. Some women also have hair loss during or after pregnancy, or hair that feels dry or thin. Your hair will most likely return to normal after your baby is born.  Your breasts will continue to grow and they will continue to become tender. A yellow fluid (colostrum) may leak from your breasts. This is the first milk you are producing for your baby.  Your belly button may stick out.  You may notice more swelling in your hands,  face, or ankles.  You may have increased tingling or numbness in your hands, arms, and legs. The skin on your belly may also feel numb.  You may feel short of breath because of your expanding uterus.  You may have more problems sleeping. This can be caused by the size of your belly, increased need to urinate, and an increase in your body's metabolism.  You may notice the fetus "dropping," or moving lower in your abdomen (lightening).  You may have increased vaginal discharge.  You may notice your joints feel loose and you may have pain around your pelvic bone. What to expect at prenatal visits You will have prenatal exams every 2 weeks until week 36. Then you will have weekly prenatal exams. During a routine prenatal visit:  You will be weighed to make sure you and the baby are growing normally.  Your blood pressure will be taken.  Your abdomen will be measured to track your baby's growth.  The fetal heartbeat will be listened to.  Any test results from the previous visit will be discussed.  You may have a cervical check near your due date to see if your cervix has softened or thinned (effaced).  You will be tested for Group B streptococcus. This happens between 35 and 37 weeks. Your health care provider may ask you:  What your birth plan is.  How you are feeling.  If you are feeling the baby move.  If you have had any abnormal   symptoms, such as leaking fluid, bleeding, severe headaches, or abdominal cramping.  If you are using any tobacco products, including cigarettes, chewing tobacco, and electronic cigarettes.  If you have any questions. Other tests or screenings that may be performed during your third trimester include:  Blood tests that check for low iron levels (anemia).  Fetal testing to check the health, activity level, and growth of the fetus. Testing is done if you have certain medical conditions or if there are problems during the pregnancy.  Nonstress test  (NST). This test checks the health of your baby to make sure there are no signs of problems, such as the baby not getting enough oxygen. During this test, a belt is placed around your belly. The baby is made to move, and its heart rate is monitored during movement. What is false labor? False labor is a condition in which you feel small, irregular tightenings of the muscles in the womb (contractions) that usually go away with rest, changing position, or drinking water. These are called Braxton Hicks contractions. Contractions may last for hours, days, or even weeks before true labor sets in. If contractions come at regular intervals, become more frequent, increase in intensity, or become painful, you should see your health care provider. What are the signs of labor?  Abdominal cramps.  Regular contractions that start at 10 minutes apart and become stronger and more frequent with time.  Contractions that start on the top of the uterus and spread down to the lower abdomen and back.  Increased pelvic pressure and dull back pain.  A watery or bloody mucus discharge that comes from the vagina.  Leaking of amniotic fluid. This is also known as your "water breaking." It could be a slow trickle or a gush. Let your health care provider know if it has a color or strange odor. If you have any of these signs, call your health care provider right away, even if it is before your due date. Follow these instructions at home: Medicines  Follow your health care provider's instructions regarding medicine use. Specific medicines may be either safe or unsafe to take during pregnancy.  Take a prenatal vitamin that contains at least 600 micrograms (mcg) of folic acid.  If you develop constipation, try taking a stool softener if your health care provider approves. Eating and drinking   Eat a balanced diet that includes fresh fruits and vegetables, whole grains, good sources of protein such as meat, eggs, or tofu,  and low-fat dairy. Your health care provider will help you determine the amount of weight gain that is right for you.  Avoid raw meat and uncooked cheese. These carry germs that can cause birth defects in the baby.  If you have low calcium intake from food, talk to your health care provider about whether you should take a daily calcium supplement.  Eat four or five small meals rather than three large meals a day.  Limit foods that are high in fat and processed sugars, such as fried and sweet foods.  To prevent constipation: ? Drink enough fluid to keep your urine clear or pale yellow. ? Eat foods that are high in fiber, such as fresh fruits and vegetables, whole grains, and beans. Activity  Exercise only as directed by your health care provider. Most women can continue their usual exercise routine during pregnancy. Try to exercise for 30 minutes at least 5 days a week. Stop exercising if you experience uterine contractions.  Avoid heavy lifting.  Do   not exercise in extreme heat or humidity, or at high altitudes.  Wear low-heel, comfortable shoes.  Practice good posture.  You may continue to have sex unless your health care provider tells you otherwise. Relieving pain and discomfort  Take frequent breaks and rest with your legs elevated if you have leg cramps or low back pain.  Take warm sitz baths to soothe any pain or discomfort caused by hemorrhoids. Use hemorrhoid cream if your health care provider approves.  Wear a good support bra to prevent discomfort from breast tenderness.  If you develop varicose veins: ? Wear support pantyhose or compression stockings as told by your healthcare provider. ? Elevate your feet for 15 minutes, 3-4 times a day. Prenatal care  Write down your questions. Take them to your prenatal visits.  Keep all your prenatal visits as told by your health care provider. This is important. Safety  Wear your seat belt at all times when driving.  Make  a list of emergency phone numbers, including numbers for family, friends, the hospital, and police and fire departments. General instructions  Avoid cat litter boxes and soil used by cats. These carry germs that can cause birth defects in the baby. If you have a cat, ask someone to clean the litter box for you.  Do not travel far distances unless it is absolutely necessary and only with the approval of your health care provider.  Do not use hot tubs, steam rooms, or saunas.  Do not drink alcohol.  Do not use any products that contain nicotine or tobacco, such as cigarettes and e-cigarettes. If you need help quitting, ask your health care provider.  Do not use any medicinal herbs or unprescribed drugs. These chemicals affect the formation and growth of the baby.  Do not douche or use tampons or scented sanitary pads.  Do not cross your legs for long periods of time.  To prepare for the arrival of your baby: ? Take prenatal classes to understand, practice, and ask questions about labor and delivery. ? Make a trial run to the hospital. ? Visit the hospital and tour the maternity area. ? Arrange for maternity or paternity leave through employers. ? Arrange for family and friends to take care of pets while you are in the hospital. ? Purchase a rear-facing car seat and make sure you know how to install it in your car. ? Pack your hospital bag. ? Prepare the baby's nursery. Make sure to remove all pillows and stuffed animals from the baby's crib to prevent suffocation.  Visit your dentist if you have not gone during your pregnancy. Use a soft toothbrush to brush your teeth and be gentle when you floss. Contact a health care provider if:  You are unsure if you are in labor or if your water has broken.  You become dizzy.  You have mild pelvic cramps, pelvic pressure, or nagging pain in your abdominal area.  You have lower back pain.  You have persistent nausea, vomiting, or  diarrhea.  You have an unusual or bad smelling vaginal discharge.  You have pain when you urinate. Get help right away if:  Your water breaks before 37 weeks.  You have regular contractions less than 5 minutes apart before 37 weeks.  You have a fever.  You are leaking fluid from your vagina.  You have spotting or bleeding from your vagina.  You have severe abdominal pain or cramping.  You have rapid weight loss or weight gain.  You have   shortness of breath with chest pain.  You notice sudden or extreme swelling of your face, hands, ankles, feet, or legs.  Your baby makes fewer than 10 movements in 2 hours.  You have severe headaches that do not go away when you take medicine.  You have vision changes. Summary  The third trimester is from week 28 through week 40, months 7 through 9. The third trimester is a time when the unborn baby (fetus) is growing rapidly.  During the third trimester, your discomfort may increase as you and your baby continue to gain weight. You may have abdominal, leg, and back pain, sleeping problems, and an increased need to urinate.  During the third trimester your breasts will keep growing and they will continue to become tender. A yellow fluid (colostrum) may leak from your breasts. This is the first milk you are producing for your baby.  False labor is a condition in which you feel small, irregular tightenings of the muscles in the womb (contractions) that eventually go away. These are called Braxton Hicks contractions. Contractions may last for hours, days, or even weeks before true labor sets in.  Signs of labor can include: abdominal cramps; regular contractions that start at 10 minutes apart and become stronger and more frequent with time; watery or bloody mucus discharge that comes from the vagina; increased pelvic pressure and dull back pain; and leaking of amniotic fluid. This information is not intended to replace advice given to you by your  health care provider. Make sure you discuss any questions you have with your health care provider. Document Released: 04/24/2001 Document Revised: 06/05/2016 Document Reviewed: 06/05/2016 [employer] Interactive Patient Education  2019 [employer]  

## 2018-06-12 NOTE — Progress Notes (Signed)
  Routine Prenatal Care Visit  Subjective  Toni Blake is a 28 y.o. [phone] at [redacted]w[redacted]d being seen today for ongoing prenatal care.  She is currently monitored for the following issues for this low-risk pregnancy and has Adjustment disorder with depressed mood; Suicidal ideation; Migraine; Cocaine abuse ([place]); Panic disorder; Tobacco use disorder; and Supervision of other normal pregnancy, antepartum on their problem list.  ----------------------------------------------------------------------------------- Patient reports no complaints.    . Vag. Bleeding: None.  Movement: Present. Denies leaking of fluid.  ----------------------------------------------------------------------------------- The following portions of the patient's history were reviewed and updated as appropriate: allergies, current medications, past family history, past medical history, past social history, past surgical history and problem list. Problem list updated.   Objective  Blood pressure 100/72, weight 148 lb (67.1 kg), last menstrual period 11/18/2017 Pregravid weight 120 lb (54.4 kg) Total Weight Gain 28 lb (12.7 kg) Urinalysis: Urine Protein    Urine Glucose    Fetal Status: Fetal Heart Rate (bpm): 144 Fundal Height: 27 cm Movement: Present     General:  Alert, oriented and cooperative. Patient is in no acute distress.  Skin: Skin is warm and dry. No rash noted.   Cardiovascular: Normal heart rate noted  Respiratory: Normal respiratory effort, no problems with respiration noted  Abdomen: Soft, gravid, appropriate for gestational age. Pain/Pressure: Absent     Pelvic:  Cervical exam deferred        Extremities: Normal range of motion.  Edema: None  Mental Status: Normal mood and affect. Normal behavior. Normal judgment and thought content.   Assessment   28 y.o. [id number] at [redacted]w[redacted]d by  09/07/2018, by Ultrasound presenting for routine prenatal visit  Plan   Pregnancy#4 Problems (from 11/18/17 to present)    Problem Noted Resolved   Supervision of other normal pregnancy, antepartum 02/06/2018 by Homero Fellers, MD No   Overview Addendum 02/19/2018  2:13 PM by Rexene Agent, [place] Prenatal Labs  Dating  Blood type:     Genetic Screen 1 Screen:     AFP:      Quad:      NIPS:    Antibody:   Anatomic [place]  Rubella:   Varicella: @VZVIGG @  GTT Early:        28 wk:      RPR:     Rhogam  HBsAg:     TDaP vaccine                       HIV:     Flu Shot                                GBS:   Contraception  Pap: 02/13/2018, NILM  CBB     CS/VBAC    Baby Food    Support Person                 Preterm labor symptoms and general obstetric precautions including but not limited to vaginal bleeding, contractions, leaking of fluid and fetal movement were reviewed in detail with the patient. Please refer to After Visit Summary for other counseling recommendations.   Return in about 2 weeks (around 06/26/2018) for rob.  Rod Can, CNM 06/12/2018 10:15 AM

## 2018-06-13 LAB — 28 WEEK RH+PANEL
BASOS: 0 %
Basophils Absolute: 0.1 10*3/uL (ref 0.0–0.2)
EOS (ABSOLUTE): 0.2 10*3/uL (ref 0.0–0.4)
Eos: 1 %
Gestational Diabetes Screen: 92 mg/dL (ref 65–139)
HIV SCREEN 4TH GENERATION: NONREACTIVE
Hematocrit: 29.6 % — ABNORMAL LOW (ref 34.0–46.6)
Hemoglobin: 10.1 g/dL — ABNORMAL LOW (ref 11.1–15.9)
IMMATURE GRANS (ABS): 0.1 10*3/uL (ref 0.0–0.1)
IMMATURE GRANULOCYTES: 1 %
LYMPHS: 18 %
Lymphocytes Absolute: 2.6 10*3/uL (ref 0.7–3.1)
MCH: 30 pg (ref 26.6–33.0)
MCHC: 34.1 g/dL (ref 31.5–35.7)
MCV: 88 fL (ref 79–97)
MONOCYTES: 5 %
Monocytes Absolute: 0.7 10*3/uL (ref 0.1–0.9)
NEUTROS PCT: 75 %
Neutrophils Absolute: 10.6 10*3/uL — ABNORMAL HIGH (ref 1.4–7.0)
Platelets: 230 10*3/uL (ref 150–450)
RBC: 3.37 x10E6/uL — AB (ref 3.77–5.28)
RDW: 13 % (ref 11.7–15.4)
RPR Ser Ql: NONREACTIVE
WBC: 14.2 10*3/uL — AB (ref 3.4–10.8)

## 2018-06-26 ENCOUNTER — Ambulatory Visit (INDEPENDENT_AMBULATORY_CARE_PROVIDER_SITE_OTHER): Payer: Medicaid Other | Admitting: Obstetrics & Gynecology

## 2018-06-26 VITALS — BP 100/60 | Wt 151.0 lb

## 2018-06-26 DIAGNOSIS — Z3A29 29 weeks gestation of pregnancy: Secondary | ICD-10-CM

## 2018-06-26 DIAGNOSIS — Z3480 Encounter for supervision of other normal pregnancy, unspecified trimester: Secondary | ICD-10-CM

## 2018-06-26 DIAGNOSIS — Z3483 Encounter for supervision of other normal pregnancy, third trimester: Secondary | ICD-10-CM

## 2018-06-26 MED ORDER — VITAFOL FE+ 90-0.6-0.4-200 MG PO CAPS
1.0000 | ORAL_CAPSULE | Freq: Every day | ORAL | 3 refills | Status: DC
Start: 2018-06-26 — End: 2018-12-09

## 2018-06-26 NOTE — Progress Notes (Signed)
  Subjective  Fetal Movement? yes Contractions? no Leaking Fluid? no Vaginal Bleeding? no  Objective  BP 100/60   Wt 151 lb (68.5 kg)   LMP 11/18/2017 (Within Days)   BMI 25.13 kg/m  General: NAD Pumonary: no increased work of breathing Abdomen: gravid, non-tender Extremities: no edema Psychiatric: mood appropriate, affect full  Assessment  28 y.o. [id number] at [redacted]w[redacted]d by  09/07/2018, by Ultrasound presenting for routine prenatal visit  Plan   Problem List Items Addressed This Visit      Other   RESOLVED: Supervision of other normal pregnancy, antepartum    Other Visit Diagnoses    [redacted] weeks gestation of pregnancy    -  Primary     Clinic [facility] Prenatal Labs  Dating [place] Blood type: O/Positive/-- (09/26 1137)   Genetic Screen NIPS: XY Antibody:Negative (09/26 1137)  Anatomic [place] WS Rubella: <0.90 (09/26 1137) Varicella: I  GTT     Third trimester: 92 RPR: Non Reactive (01/30 1102)   Rhogam na HBsAg: Negative (09/26 1137)   TDaP vaccine  declinedFlu Shot:declined HIV: Non Reactive (01/30 1102)   Baby Food            Breast                    GBS: 36 weeks pending  Contraception BTL vs other Pap:02/13/2018 normal   Pros and cons of PP BTL discussed, regret risk at age 61 counseled  Barnett Applebaum, MD, [facility], [facility] 06/26/2018  11:33 AM

## 2018-06-26 NOTE — Patient Instructions (Addendum)
What You Need to Know About Female Sterilization  Female sterilization is surgery to prevent pregnancy. In this surgery, the fallopian tubes are either blocked or closed off. This prevents eggs from reaching the uterus so that the eggs cannot be fertilized by sperm and you cannot get pregnant.  Sterilization is permanent. It should only be done if you are sure that you do not want to be able to have children.  What are the sterilization surgery options?  There are several kinds of female sterilization surgeries. They include:   Laparoscopic tubal ligation. In this surgery, the fallopian tubes are tied off, sealed with heat, or blocked with a clip, ring, or clamp. A small portion of each fallopian tube may also be removed. This surgery is done through several small cuts (incisions).   Postpartum tubal ligation. This is also called a mini-laparotomy. This surgery is done right after childbirth or 1 or 2 days after childbirth. In this surgery, the fallopian tubes are tied off, sealed with heat, or blocked with a clip, ring, or clamp. A small portion of each fallopian tube may also be removed. The surgery is done through a single incision.   Hysteroscopic sterilization. In this surgery, a tiny, spring-like coil is inserted through the cervix and uterus into the fallopian tubes. The coil causes scarring, which blocks the tubes. After the surgery, contraception should be used for 3 months to allow the scar tissue to form completely.  Is sterilization safe?  Generally, sterilization is safe. Complications are rare. However, there are risks. They include:   Bleeding.   Infection.   Reaction to medicine used during the procedure.   Injury to surrounding organs.   Failure of the procedure.  How effective is sterilization?  Sterilization is nearly 100% effective, but it can fail. Also, the fallopian tubes can grow back together over time. If this happens, you will be able to get pregnant again.  Women who have had this  procedure have a higher chance of having an ectopic pregnancy. An ectopic pregnancy is a pregnancy that happens outside of the uterus. This kind of pregnancy is unsuccessful and can lead to serious bleeding if it is not treated.  What are the benefits?   It is usually effective for a lifetime.   It is usually safe.   It does not have the drawbacks of other types of birth control: That means:  ? Your hormones are not affected. Because of this, your menstrual periods, sexual desire, and sexual performance will not be affected.  ? There are no side effects.  What are the drawbacks?   If you change your mind and decide that you want to have children, you may not be able to. Sterilization may be reversed, but a reversal is not always successful.   It does not provide protection against STDs (sexually transmitted diseases).   It increases the chance of having an ectopic pregnancy.  This information is not intended to replace advice given to you by your health care provider. Make sure you discuss any questions you have with your health care provider.  Document Released: 10/17/2007 Document Revised: 12/22/2015 Document Reviewed: 01/25/2015  Elsevier Interactive Patient Education  2019 Elsevier Inc.

## 2018-07-10 ENCOUNTER — Encounter: Payer: Medicaid Other | Admitting: Maternal Newborn

## 2018-07-16 ENCOUNTER — Encounter: Payer: Self-pay | Admitting: Advanced Practice Midwife

## 2018-07-16 ENCOUNTER — Ambulatory Visit (INDEPENDENT_AMBULATORY_CARE_PROVIDER_SITE_OTHER): Payer: Medicaid Other | Admitting: Advanced Practice Midwife

## 2018-07-16 VITALS — BP 98/60 | Wt 154.0 lb

## 2018-07-16 DIAGNOSIS — Z23 Encounter for immunization: Secondary | ICD-10-CM

## 2018-07-16 DIAGNOSIS — Z3A32 32 weeks gestation of pregnancy: Secondary | ICD-10-CM

## 2018-07-16 LAB — POCT URINALYSIS DIPSTICK OB
Glucose, UA: NEGATIVE
PROTEIN: NEGATIVE

## 2018-07-16 NOTE — Progress Notes (Signed)
  Routine Prenatal Care Visit  Subjective  Toni Blake is a 28 y.o. [phone] at [redacted]w[redacted]d being seen today for ongoing prenatal care.  She is currently monitored for the following issues for this low-risk pregnancy and has Adjustment disorder with depressed mood; Suicidal ideation; Migraine; Cocaine abuse ([place]); Panic disorder; and Tobacco use disorder on their problem list.  ----------------------------------------------------------------------------------- Patient reports feeling some pelvic pressure.   Contractions: Not present. Vag. Bleeding: None.  Movement: Present. Denies leaking of fluid.  ----------------------------------------------------------------------------------- The following portions of the patient's history were reviewed and updated as appropriate: allergies, current medications, past family history, past medical history, past social history, past surgical history and problem list. Problem list updated.   Objective  Blood pressure 98/60, weight 154 lb (69.9 kg), last menstrual period 11/18/2017 Pregravid weight 120 lb (54.4 kg) Total Weight Gain 34 lb (15.4 kg) Urinalysis: Urine Protein    Urine Glucose    Fetal Status: Fetal Heart Rate (bpm): 145 Fundal Height: 32 cm Movement: Present     General:  Alert, oriented and cooperative. Patient is in no acute distress.  Skin: Skin is warm and dry. No rash noted.   Cardiovascular: Normal heart rate noted  Respiratory: Normal respiratory effort, no problems with respiration noted  Abdomen: Soft, gravid, appropriate for gestational age. Pain/Pressure: Present     Pelvic:  Cervical exam deferred        Extremities: Normal range of motion.  Edema: None  Mental Status: Normal mood and affect. Normal behavior. Normal judgment and thought content.   Assessment   28 y.o. [id number] at [redacted]w[redacted]d by  09/07/2018, by Ultrasound presenting for routine prenatal visit  Plan   Pregnancy#4 Problems (from 11/18/17 to present)    Problem  Noted Resolved   Supervision of other normal pregnancy, antepartum 02/06/2018 by Homero Fellers, MD 06/26/2018 by Gae Dry, MD   Overview Addendum 06/26/2018 11:32 AM by Gae Dry, MD    Clinic [facility] Prenatal Labs  Dating [place] Blood type: O/Positive/-- (09/26 1137)   Genetic Screen NIPS: XY Antibody:Negative (09/26 1137)  Anatomic [place] WS Rubella: <0.90 (09/26 1137) Varicella: I  GTT     Third trimester: 92 RPR: Non Reactive (01/30 1102)   Rhogam na HBsAg: Negative (09/26 1137)   TDaP vaccine         Flu Shot:declined HIV: Non Reactive (01/30 1102)   Baby Food            Breast                    GBS:   Contraception BTL vs other Pap:02/13/2018 normal  CBB     CS/VBAC    Support Person                  Preterm labor symptoms and general obstetric precautions including but not limited to vaginal bleeding, contractions, leaking of fluid and fetal movement were reviewed in detail with the patient.  Tubal consent signed TDAP given Blood tx consent signed   Return in about 2 weeks (around 07/30/2018) for rob.  Rod Can, CNM 07/16/2018 11:11 AM

## 2018-07-16 NOTE — Progress Notes (Signed)
ROB/TDap/BT consent form today- no concerns

## 2018-07-21 ENCOUNTER — Observation Stay
Admission: EM | Admit: 2018-07-21 | Discharge: 2018-07-22 | Disposition: A | Discharge status: Home / Self Care | Payer: Medicaid Other | Attending: Obstetrics and Gynecology | Admitting: Obstetrics and Gynecology

## 2018-07-21 ENCOUNTER — Other Ambulatory Visit: Payer: Self-pay

## 2018-07-21 DIAGNOSIS — O99333 Smoking (tobacco) complicating pregnancy, third trimester: Secondary | ICD-10-CM | POA: Insufficient documentation

## 2018-07-21 DIAGNOSIS — O2343 Unspecified infection of urinary tract in pregnancy, third trimester: Secondary | ICD-10-CM | POA: Diagnosis not present

## 2018-07-21 DIAGNOSIS — Z3A33 33 weeks gestation of pregnancy: Secondary | ICD-10-CM | POA: Diagnosis not present

## 2018-07-21 DIAGNOSIS — M545 Low back pain: Secondary | ICD-10-CM | POA: Diagnosis present

## 2018-07-21 DIAGNOSIS — F17210 Nicotine dependence, cigarettes, uncomplicated: Secondary | ICD-10-CM | POA: Diagnosis not present

## 2018-07-21 LAB — URINALYSIS, COMPLETE (UACMP) WITH MICROSCOPIC
BILIRUBIN URINE: NEGATIVE
GLUCOSE, UA: NEGATIVE mg/dL
Hgb urine dipstick: NEGATIVE
Ketones, ur: 5 mg/dL — AB
Nitrite: NEGATIVE
PH: 6 (ref 5.0–8.0)
Protein, ur: 30 mg/dL — AB
Specific Gravity, Urine: 1.03 (ref 1.005–1.030)

## 2018-07-21 NOTE — OB Triage Note (Signed)
Pt is a 27y/o at [redacted]w[redacted]d with c/o mid lower back pain that began 1300. Pt denies urinary symptoms. Pt denies ctx, LOF, or vaginal bleeding. Pt states positive fetal movement. Monitors applied and assessing. Initial FHT 140

## 2018-07-22 DIAGNOSIS — Z3A33 33 weeks gestation of pregnancy: Secondary | ICD-10-CM | POA: Diagnosis not present

## 2018-07-22 DIAGNOSIS — O2343 Unspecified infection of urinary tract in pregnancy, third trimester: Secondary | ICD-10-CM | POA: Diagnosis not present

## 2018-07-22 DIAGNOSIS — M545 Low back pain: Secondary | ICD-10-CM

## 2018-07-22 DIAGNOSIS — O26893 Other specified pregnancy related conditions, third trimester: Secondary | ICD-10-CM | POA: Diagnosis not present

## 2018-07-22 LAB — FETAL FIBRONECTIN: Fetal Fibronectin: NEGATIVE

## 2018-07-22 MED ORDER — CEPHALEXIN 500 MG PO CAPS: 500.0000 mg | ORAL_CAPSULE | Freq: Two times a day (BID) | ORAL | 0 refills | Status: AC

## 2018-07-22 NOTE — Discharge Summary (Signed)
See Final Progress Note 07/22/2018.

## 2018-07-22 NOTE — Final Progress Note (Signed)
Physician Final Progress Note  Patient ID: Toni Blake MRN: [id number] DOB/AGE: 07/05/1990 28 y.o.  Admit date: 07/21/2018 Admitting provider: Malachy Mood, MD Discharge date: 07/22/2018   Admission Diagnoses: Back pain in pregnancy  Discharge Diagnoses: Urinary tract infection  History of Present Illness: The patient is a 28 y.o. female [phone] at [redacted]w[redacted]d who presents for persistent lower back pain beginning around 1300 which is constant; she rates the pain 12 5/10 and nothing makes it better or worse. She has no dysuria. She denies vaginal bleeding, loss of fluid, and contractions. Her baby is moving well.  Review of Systems: Review of systems negative unless otherwise noted in HPI.   Past Medical History:  Diagnosis Date  . Anemia   . Anxiety   . Depression   . Migraine     Past Surgical History:  Procedure Laterality Date  . 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    Current Outpatient Medications on File Prior to Encounter  Medication Sig Dispense Refill  . Prenat-Fe Poly-Methfol-FA-DHA (VITAFOL FE+) 90-0.6-0.4-200 MG CAPS Take 1 capsule by mouth daily. 30 capsule 3  . Prenatal Vit-Fe Fumarate-FA (MULTIVITAMIN-PRENATAL) 27-0.8 MG TABS tablet Take 1 tablet by mouth daily at 12 noon.      No Known Allergies  Social History   Socioeconomic History  . Marital status: Single    Spouse name: Not on file  . Number of children: 1  . Years of education: Not on file  . Highest education level: Not on file  Occupational History  . Not on file  Social Needs  . Financial resource strain: Not on file  . Food insecurity:    Worry: Not on file    Inability: Not on file  . Transportation needs:    Medical: Not on file    Non-medical: Not on file  Tobacco Use  . Smoking status: Current Every Day Smoker    Packs/day: 1.00    Types: Cigarettes  . Smokeless tobacco: Never Used   Substance and Sexual Activity  . Alcohol use: No  . Drug use: No    Comment: Past use of MJ and cocaine  . Sexual activity: Yes    Partners: Male    Birth control/protection: Surgical    Comment: BTL  Lifestyle  . Physical activity:    Days per week: Not on file    Minutes per session: Not on file  . Stress: Not on file  Relationships  . Social connections:    Talks on phone: Not on file    Gets together: Not on file    Attends religious service: Not on file    Active member of club or organization: Not on file    Attends meetings of clubs or organizations: Not on file    Relationship status: Not on file  . Intimate partner violence:    Fear of current or ex partner: Not on file    Emotionally abused: Not on file    Physically abused: Not on file    Forced sexual activity: Not on file  Other Topics Concern  . Not on file  Social History Narrative  . Not on file    Family history:  Family History  Problem Relation Age of Onset  . Depression Mother   . Migraines Mother   . Asthma Father   . Depression Sister   . Diabetes Paternal Grandmother     Physical Exam:  BP 113/62 (BP Location: Left Arm)   Pulse 95   Resp 16   Ht 5\' 5"  (1.651 m)   Wt 70.3 kg   LMP 11/18/2017 (Within Days)   BMI 25.79 kg/m   Gen: NAD CV: Regular rate Pulm: No increased work of breathing Pelvic: 0/30/-3, fetal fibronectin collected, no abnormal discharge Ext: No signs of DVT  NST Baseline: 125 Variability: moderate Accelerations: present Decelerations: absent Tocometry: irregular, with uterine irritability The patient was monitored for >30 minutes, fetal heart rate tracing was deemed reactive.  Significant Findings/ Diagnostic Studies: labs: UA positive for small leukocytes with some bacteria and RBCs seen; fetal fibronectin negative  Procedures: NST  Discharge Condition: stable  Disposition: Discharge disposition: 01-Home or Self Care       Diet: Regular  diet  Discharge Activity: Activity as tolerated  Discharge Instructions    Discharge activity:  No Restrictions   Complete by:  As directed    Discharge diet:  No restrictions   Complete by:  As directed    No sexual activity restrictions   Complete by:  As directed    Notify physician for a general feeling that "something is not right"   Complete by:  As directed    Notify physician for increase or change in vaginal discharge   Complete by:  As directed    Notify physician for intestinal cramps, with or without diarrhea, sometimes described as "gas pain"   Complete by:  As directed    Notify physician for leaking of fluid   Complete by:  As directed    Notify physician for low, dull backache, unrelieved by heat or Tylenol   Complete by:  As directed    Notify physician for menstrual like cramps   Complete by:  As directed    Notify physician for pelvic pressure   Complete by:  As directed    Notify physician for uterine contractions.  These may be painless and feel like the uterus is tightening or the baby is  "balling up"   Complete by:  As directed    Notify physician for vaginal bleeding   Complete by:  As directed    PRETERM LABOR:  Includes any of the follwing symptoms that occur between 20 - [redacted] weeks gestation.  If these symptoms are not stopped, preterm labor can result in preterm delivery, placing your baby at risk   Complete by:  As directed      Allergies as of 07/22/2018   No Known Allergies     Medication List    STOP taking these medications   multivitamin-prenatal 27-0.8 MG Tabs tablet     TAKE these medications   cephALEXin 500 MG capsule Commonly known as:  KEFLEX Take 1 capsule (500 mg total) by mouth 2 (two) times daily for 7 days.   Vitafol FE+ 90-0.6-0.4-200 MG Caps Take 1 capsule by mouth daily.      Cervix closed and fetal fibronectin negative. Treat empirically for UTI. Urine culture pending, will inform patient if results necessitate change  in antibiotic therapy.  Signed: Rexene Agent, CNM  07/22/2018

## 2018-07-23 LAB — URINE CULTURE: Culture: >=100000 — AB

## 2018-07-30 ENCOUNTER — Encounter: Payer: Medicaid Other | Admitting: Maternal Newborn

## 2018-08-05 ENCOUNTER — Ambulatory Visit (INDEPENDENT_AMBULATORY_CARE_PROVIDER_SITE_OTHER): Payer: Medicaid Other | Admitting: Obstetrics & Gynecology

## 2018-08-05 ENCOUNTER — Other Ambulatory Visit: Payer: Self-pay

## 2018-08-05 ENCOUNTER — Telehealth: Payer: Self-pay | Admitting: Obstetrics & Gynecology

## 2018-08-05 DIAGNOSIS — Z3A35 35 weeks gestation of pregnancy: Secondary | ICD-10-CM

## 2018-08-05 DIAGNOSIS — Z3480 Encounter for supervision of other normal pregnancy, unspecified trimester: Secondary | ICD-10-CM

## 2018-08-05 DIAGNOSIS — Z3483 Encounter for supervision of other normal pregnancy, third trimester: Secondary | ICD-10-CM

## 2018-08-05 NOTE — Progress Notes (Signed)
Virtual Visit via Telephone Note  I connected with Toni Blake on 08/05/18 at 10:20 AM EDT by telephone and verified that I am speaking with the correct person using two identifiers.   I discussed the limitations, risks, security and privacy concerns of performing an evaluation and management service by telephone and the availability of in person appointments. I also discussed with the patient that there may be a patient responsible charge related to this service. The patient expressed understanding and agreed to proceed. She was at home and I was in my office.   History of Present Illness: Pt is [redacted] weeks pregnant without complaints today.  Occasional braxton hicks ctxs.  No VB, ROM.  Good FM   Observations/Objective: No exam today, due to telephone eVisit due to [facility] virus restriction on elective visits and procedures.  Prior visits reviewed along with ultrasounds/labs as indicated.  Assessment and Plan: Cont to monitor for s/sx PTL.  Take PNV.  Monitor FM.  Follow Up Instructions: Plan office visit next week for BP check, FHTs, and GBS culture/exam.   I discussed the assessment and treatment plan with the patient. The patient was provided an opportunity to ask questions and all were answered. The patient agreed with the plan and demonstrated an understanding of the instructions.   The patient was advised to call back or seek an in-person evaluation if the symptoms worsen or if the condition fails to improve as anticipated.  I provided 7 minutes of non-face-to-face time during this encounter.   Hoyt Koch, MD

## 2018-08-05 NOTE — Telephone Encounter (Signed)
-----   Message from Gae Dry, MD sent at 08/05/2018 10:52 AM EDT ----- Regarding: ROB appt next week in office

## 2018-08-05 NOTE — Telephone Encounter (Signed)
Patient is schedule 08/12/18

## 2018-08-12 ENCOUNTER — Ambulatory Visit (INDEPENDENT_AMBULATORY_CARE_PROVIDER_SITE_OTHER): Payer: Medicaid Other | Admitting: Certified Nurse Midwife

## 2018-08-12 ENCOUNTER — Other Ambulatory Visit: Payer: Self-pay

## 2018-08-12 ENCOUNTER — Other Ambulatory Visit (HOSPITAL_COMMUNITY)
Admission: RE | Admit: 2018-08-12 | Discharge: 2018-08-12 | Disposition: A | Discharge status: Home / Self Care | Payer: Medicaid Other | Source: Ambulatory Visit | Attending: Certified Nurse Midwife | Admitting: Certified Nurse Midwife

## 2018-08-12 VITALS — BP 110/50 | Wt 156.0 lb

## 2018-08-12 DIAGNOSIS — Z113 Encounter for screening for infections with a predominantly sexual mode of transmission: Secondary | ICD-10-CM | POA: Diagnosis present

## 2018-08-12 DIAGNOSIS — O0993 Supervision of high risk pregnancy, unspecified, third trimester: Secondary | ICD-10-CM

## 2018-08-12 DIAGNOSIS — O0990 Supervision of high risk pregnancy, unspecified, unspecified trimester: Secondary | ICD-10-CM | POA: Insufficient documentation

## 2018-08-12 DIAGNOSIS — Z3685 Encounter for antenatal screening for Streptococcus B: Secondary | ICD-10-CM

## 2018-08-12 DIAGNOSIS — Z3A36 36 weeks gestation of pregnancy: Secondary | ICD-10-CM

## 2018-08-12 MED ORDER — CLOTRIMAZOLE-BETAMETHASONE 1-0.05 % EX CREA: 1.0000 "application " | TOPICAL_CREAM | Freq: Two times a day (BID) | CUTANEOUS | 0 refills | Status: DC | PRN

## 2018-08-12 MED ORDER — FLUCONAZOLE 150 MG PO TABS: ORAL_TABLET | ORAL | 0 refills | Status: DC

## 2018-08-12 NOTE — Progress Notes (Signed)
ROB at [place] 2days: doing well. Good fetal movement. Irregular BH contractions. No vaginal bleeding or leakage of fluid Cervix: FT/60%/-1 to 0/ vertex GBS and Aptima done Labor precautions ROB 1 week  Dalia Heading, CNM

## 2018-08-12 NOTE — Progress Notes (Signed)
ROB No concerns  GBS/Aptima today

## 2018-08-13 LAB — CERVICOVAGINAL ANCILLARY ONLY
Chlamydia: NEGATIVE
Neisseria Gonorrhea: NEGATIVE

## 2018-08-14 LAB — STREP GP B NAA: Strep Gp B NAA: NEGATIVE

## 2018-08-19 ENCOUNTER — Encounter: Payer: Self-pay | Admitting: Advanced Practice Midwife

## 2018-08-19 ENCOUNTER — Other Ambulatory Visit: Payer: Self-pay

## 2018-08-19 ENCOUNTER — Encounter: Payer: Medicaid Other | Admitting: Maternal Newborn

## 2018-08-19 ENCOUNTER — Ambulatory Visit (INDEPENDENT_AMBULATORY_CARE_PROVIDER_SITE_OTHER): Payer: Medicaid Other | Admitting: Advanced Practice Midwife

## 2018-08-19 VITALS — BP 100/50 | Wt 160.0 lb

## 2018-08-19 DIAGNOSIS — Z3483 Encounter for supervision of other normal pregnancy, third trimester: Secondary | ICD-10-CM

## 2018-08-19 DIAGNOSIS — Z3A37 37 weeks gestation of pregnancy: Secondary | ICD-10-CM

## 2018-08-19 LAB — POCT URINALYSIS DIPSTICK OB
Glucose, UA: NEGATIVE
POC,PROTEIN,UA: NEGATIVE

## 2018-08-19 NOTE — Patient Instructions (Signed)
Braxton Hicks Contractions Contractions of the uterus can occur throughout pregnancy, but they are not always a sign that you are in labor. You may have practice contractions called Braxton Hicks contractions. These false labor contractions are sometimes confused with true labor. What are Braxton Hicks contractions? Braxton Hicks contractions are tightening movements that occur in the muscles of the uterus before labor. Unlike true labor contractions, these contractions do not result in opening (dilation) and thinning of the cervix. Toward the end of pregnancy (32-34 weeks), Braxton Hicks contractions can happen more often and may become stronger. These contractions are sometimes difficult to tell apart from true labor because they can be very uncomfortable. You should not feel embarrassed if you go to the hospital with false labor. Sometimes, the only way to tell if you are in true labor is for your health care provider to look for changes in the cervix. The health care provider will do a physical exam and may monitor your contractions. If you are not in true labor, the exam should show that your cervix is not dilating and your water has not broken. If there are no other health problems associated with your pregnancy, it is completely safe for you to be sent home with false labor. You may continue to have Braxton Hicks contractions until you go into true labor. How to tell the difference between true labor and false labor True labor  Contractions last 30-70 seconds.  Contractions become very regular.  Discomfort is usually felt in the top of the uterus, and it spreads to the lower abdomen and low back.  Contractions do not go away with walking.  Contractions usually become more intense and increase in frequency.  The cervix dilates and gets thinner. False labor  Contractions are usually shorter and not as strong as true labor contractions.  Contractions are usually irregular.  Contractions  are often felt in the front of the lower abdomen and in the groin.  Contractions may go away when you walk around or change positions while lying down.  Contractions get weaker and are shorter-lasting as time goes on.  The cervix usually does not dilate or become thin. Follow these instructions at home:   Take over-the-counter and prescription medicines only as told by your health care provider.  Keep up with your usual exercises and follow other instructions from your health care provider.  Eat and drink lightly if you think you are going into labor.  If Braxton Hicks contractions are making you uncomfortable: ? Change your position from lying down or resting to walking, or change from walking to resting. ? Sit and rest in a tub of warm water. ? Drink enough fluid to keep your urine pale yellow. Dehydration may cause these contractions. ? Do slow and deep breathing several times an hour.  Keep all follow-up prenatal visits as told by your health care provider. This is important. Contact a health care provider if:  You have a fever.  You have continuous pain in your abdomen. Get help right away if:  Your contractions become stronger, more regular, and closer together.  You have fluid leaking or gushing from your vagina.  You pass blood-tinged mucus (bloody show).  You have bleeding from your vagina.  You have low back pain that you never had before.  You feel your baby's head pushing down and causing pelvic pressure.  Your baby is not moving inside you as much as it used to. Summary  Contractions that occur before labor are   called Braxton Hicks contractions, false labor, or practice contractions.  Braxton Hicks contractions are usually shorter, weaker, farther apart, and less regular than true labor contractions. True labor contractions usually become progressively stronger and regular, and they become more frequent.  Manage discomfort from Braxton Hicks contractions  by changing position, resting in a warm bath, drinking plenty of water, or practicing deep breathing. This information is not intended to replace advice given to you by your health care provider. Make sure you discuss any questions you have with your health care provider. Document Released: 09/13/2016 Document Revised: 02/12/2017 Document Reviewed: 09/13/2016 Elsevier Interactive Patient Education  2019 [employer]  

## 2018-08-19 NOTE — Progress Notes (Signed)
  Routine Prenatal Care Visit  Subjective  Toni Blake is a 28 y.o. [phone] at [redacted]w[redacted]d being seen today for ongoing prenatal care.  She is currently monitored for the following issues for this high-risk pregnancy and has Adjustment disorder with depressed mood; Suicidal ideation; Migraine; Cocaine abuse ([place]); Panic disorder; Tobacco use disorder; Indication for care in labor and delivery, antepartum; and Supervision of high risk pregnancy, antepartum on their problem list.  ----------------------------------------------------------------------------------- Patient reports no complaints.   Contractions: Not present. Vag. Bleeding: None.  Movement: Present. Denies leaking of fluid.  ----------------------------------------------------------------------------------- The following portions of the patient's history were reviewed and updated as appropriate: allergies, current medications, past family history, past medical history, past social history, past surgical history and problem list. Problem list updated.   Objective  Blood pressure (!) 100/50, weight 160 lb (72.6 kg), last menstrual period 11/18/2017, unknown if currently breastfeeding. Pregravid weight 120 lb (54.4 kg) Total Weight Gain 40 lb (18.1 kg) Urinalysis: Urine Protein Negative  Urine Glucose Negative  Fetal Status: Fetal Heart Rate (bpm): 142 Fundal Height: 36 cm Movement: Present     General:  Alert, oriented and cooperative. Patient is in no acute distress.  Skin: Skin is warm and dry. No rash noted.   Cardiovascular: Normal heart rate noted  Respiratory: Normal respiratory effort, no problems with respiration noted  Abdomen: Soft, gravid, appropriate for gestational age. Pain/Pressure: Absent     Pelvic:  Cervical exam deferred        Extremities: Normal range of motion.     Mental Status: Normal mood and affect. Normal behavior. Normal judgment and thought content.   Assessment   28 y.o. [id number] at [redacted]w[redacted]d by   09/07/2018, by Ultrasound presenting for routine prenatal visit  Plan   Pregnancy#4 Problems (from 11/18/17 to present)    Problem Noted Resolved   Supervision of other normal pregnancy, antepartum 02/06/2018 by Homero Fellers, MD 06/26/2018 by Gae Dry, MD   Overview Addendum 06/26/2018 11:32 AM by Gae Dry, MD    Clinic [facility] Prenatal Labs  Dating [place] Blood type: O/Positive/-- (09/26 1137)   Genetic Screen NIPS: XY Antibody:Negative (09/26 1137)  Anatomic [place] WS Rubella: <0.90 (09/26 1137) Varicella: I  GTT     Third trimester: 92 RPR: Non Reactive (01/30 1102)   Rhogam na HBsAg: Negative (09/26 1137)   TDaP vaccine         Flu Shot:declined HIV: Non Reactive (01/30 1102)   Baby Food            Breast                    GBS:   Contraception BTL vs other Pap:02/13/2018 normal  CBB     CS/VBAC    Support Person                  Term labor symptoms and general obstetric precautions including but not limited to vaginal bleeding, contractions, leaking of fluid and fetal movement were reviewed in detail with the patient. Please refer to After Visit Summary for other counseling recommendations.   Return in about 1 week (around 08/26/2018) for rob.  Rod Can, CNM 08/19/2018 9:47 AM

## 2018-08-19 NOTE — Progress Notes (Signed)
ROB- no concerns 

## 2018-08-26 ENCOUNTER — Encounter: Payer: Self-pay | Admitting: Advanced Practice Midwife

## 2018-08-26 ENCOUNTER — Ambulatory Visit (INDEPENDENT_AMBULATORY_CARE_PROVIDER_SITE_OTHER): Payer: Medicaid Other | Admitting: Advanced Practice Midwife

## 2018-08-26 ENCOUNTER — Other Ambulatory Visit: Payer: Self-pay

## 2018-08-26 DIAGNOSIS — Z3483 Encounter for supervision of other normal pregnancy, third trimester: Secondary | ICD-10-CM

## 2018-08-26 DIAGNOSIS — Z3A38 38 weeks gestation of pregnancy: Secondary | ICD-10-CM

## 2018-08-26 NOTE — Progress Notes (Signed)
Routine Prenatal Care Visit- Virtual Visit  Subjective   Virtual Visit via Telephone Note  I connected with@ on 08/26/18 at 10:30 AM EDT by telephone and verified that I am speaking with the correct person using two identifiers.   I discussed the limitations, risks, security and privacy concerns of performing an evaluation and management service by telephone and the availability of in person appointments. I also discussed with the patient that there may be a patient responsible charge related to this service. The patient expressed understanding and agreed to proceed.  The patient was at home I spoke with the patient from my  Office phone The names of people involved in this encounter were: Rod Can CNM , and patient Toni Blake.   Toni Blake is a 28 y.o. [phone] at [redacted]w[redacted]d being seen today for ongoing prenatal care.  She is currently monitored for the following issues for this high-risk pregnancy and has Adjustment disorder with depressed mood; Suicidal ideation; Migraine; Cocaine abuse ([place]); Panic disorder; Tobacco use disorder; Indication for care in labor and delivery, antepartum; and Supervision of high risk pregnancy, antepartum on their problem list.  ----------------------------------------------------------------------------------- Patient reports no complaints.  She has read that she shouldn't sleep on her back and she finds herself in that position sometimes.  Contractions: Not present. Vag. Bleeding: None.  Movement: Present. Denies leaking of fluid.  ----------------------------------------------------------------------------------- The following portions of the patient's history were reviewed and updated as appropriate: allergies, current medications, past family history, past medical history, past social history, past surgical history and problem list. Problem list updated.   Objective  Last menstrual period 11/18/2017 Pregravid weight 120 lb (54.4 kg)  Total Weight Gain 40 lb (18.1 kg) Urinalysis:      Fetal Status:     Movement: Present     Physical Exam could not be performed. Because of the COVID-19 outbreak this visit was performed over the phone and not in person.   Assessment   28 y.o. [id number] at [redacted]w[redacted]d by  09/07/2018, by Ultrasound presenting for routine prenatal visit  Plan   Pregnancy#4 Problems (from 11/18/17 to present)    Problem Noted Resolved   Supervision of other normal pregnancy, antepartum 02/06/2018 by Homero Fellers, MD 06/26/2018 by Gae Dry, MD   Overview Addendum 06/26/2018 11:32 AM by Gae Dry, MD    Clinic [facility] Prenatal Labs  Dating [place] Blood type: O/Positive/-- (09/26 1137)   Genetic Screen NIPS: XY Antibody:Negative (09/26 1137)  Anatomic [place] WS Rubella: <0.90 (09/26 1137) Varicella: I  GTT     Third trimester: 92 RPR: Non Reactive (01/30 1102)   Rhogam na HBsAg: Negative (09/26 1137)   TDaP vaccine         Flu Shot:declined HIV: Non Reactive (01/30 1102)   Baby Food            Breast                    GBS:   Contraception BTL vs other Pap:02/13/2018 normal  CBB     CS/VBAC    Support Person                  Gestational age appropriate obstetric precautions including but not limited to vaginal bleeding, contractions, leaking of fluid and fetal movement were reviewed in detail with the patient.     Follow Up Instructions: Avoid sleeping flat on your back Use pillows for help with sleep positioning   I discussed  the assessment and treatment plan with the patient. The patient was provided an opportunity to ask questions and all were answered. The patient agreed with the plan and demonstrated an understanding of the instructions.   The patient was advised to call back or seek an in-person evaluation if the symptoms worsen or if the condition fails to improve as anticipated.  I provided 10 minutes of non-face-to-face time during this encounter.  Return in about 1 week  (around 09/02/2018) for rob.  Christean Leaf, CNM [facility] 08/26/2018, 10:44 AM

## 2018-08-26 NOTE — Progress Notes (Signed)
ROB No concerns

## 2018-08-29 ENCOUNTER — Other Ambulatory Visit: Payer: Self-pay

## 2018-08-29 ENCOUNTER — Observation Stay
Admission: EM | Admit: 2018-08-29 | Discharge: 2018-08-29 | Disposition: A | Discharge status: Home / Self Care | Payer: Medicaid Other | Source: Home / Self Care | Admitting: Obstetrics and Gynecology

## 2018-08-29 DIAGNOSIS — O26893 Other specified pregnancy related conditions, third trimester: Secondary | ICD-10-CM | POA: Insufficient documentation

## 2018-08-29 DIAGNOSIS — O99343 Other mental disorders complicating pregnancy, third trimester: Secondary | ICD-10-CM | POA: Insufficient documentation

## 2018-08-29 DIAGNOSIS — R109 Unspecified abdominal pain: Secondary | ICD-10-CM | POA: Insufficient documentation

## 2018-08-29 DIAGNOSIS — F329 Major depressive disorder, single episode, unspecified: Secondary | ICD-10-CM | POA: Insufficient documentation

## 2018-08-29 DIAGNOSIS — O99333 Smoking (tobacco) complicating pregnancy, third trimester: Secondary | ICD-10-CM | POA: Insufficient documentation

## 2018-08-29 DIAGNOSIS — Z3A38 38 weeks gestation of pregnancy: Secondary | ICD-10-CM

## 2018-08-29 DIAGNOSIS — F419 Anxiety disorder, unspecified: Secondary | ICD-10-CM | POA: Insufficient documentation

## 2018-08-29 NOTE — Discharge Instructions (Signed)
LABOR: When contractions begin, you should start to time them from the beginning of one contraction to the beginning of the next.  When contractions are 5-10 minutes apart or less and have been regular for at least an hour, you should call your health care provider.  Notify your doctor if any of the following occur: 1. Bleeding from the vagina 7. Sudden, constant, or occasional abdominal pain  2. Pain or burning when urinating 8. Sudden gushing of fluid from the vagina (with or without continued leaking)  3. Chills or fever 9. Fainting spells, "black outs" or loss of consciousness  4. Increase in vaginal discharge 10. Severe or continued nausea or vomiting  5. Pelvic pressure (sudden increase) 11. Blurring of vision or spots before the eyes  6. Baby moving less than usual 12. Leaking of fluid    FETAL KICK COUNT: Lie on your left side for one hour after a meal, and count the number of times your baby kicks. If it is less than 5 times, get up, move around and drink some juice. Repeat the test 30 minutes later. If it is still less than 5 kicks in an hour, notify your doctor.

## 2018-08-29 NOTE — OB Triage Note (Signed)
Pt is a 27yo G4P2 at [redacted]w[redacted]d that presents from the ED with c/o ctx that started around 0500 that are 5-15 minutes apart. Pt rates the ctx 9/10 on pain scale and states she drank water and tried laying on her side with no relief. Pt denies VB, LOF and states positive FM. Pt has no other complaints at this time. Vitals are WDL and initial FHT is 155 with monitors applied and assessing.

## 2018-08-29 NOTE — Discharge Summary (Signed)
Physician Discharge Summary Note  Patient ID: Toni Blake MRN: [id number] DOB/AGE: Apr 07, 1991 27 y.o.  Admit date: 08/29/2018 Admitting provider: Will Bonnet, MD Discharge date: 08/29/2018   Admission Diagnoses:  1) intrauterine pregnancy at [redacted]w[redacted]d 2) abdominal pain affecting pregnancy, third trimester  Discharge Diagnoses:  1) intrauterine pregnancy at [redacted]w[redacted]d 2) abdominal pain affecting pregnancy, third trimester - false labor  History of Present Illness: 28 y.o. [id number] [redacted]w[redacted]d by 09/07/2018, by Ultrasound presenting to L&D for painful contractions that began this morning around 0500. She had a few contractions yesterday, but they were not painful compared to what she is experiencing today. They are coming every 5-15 minutes. She rates the pain as 9/10. Able to talk through contractions. She has tried drinking water and resting with no relief. No loss of fluid or vaginal bleeding. Baby is moving well. No other complaints.   Past Medical History:  Diagnosis Date  . Anemia   . Anxiety   . Depression   . Migraine     Past Surgical History:  Procedure Laterality Date  . 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    Current Outpatient Medications on File Prior to Encounter  Medication Sig Dispense Refill  . Prenat-Fe Poly-Methfol-FA-DHA (VITAFOL FE+) 90-0.6-0.4-200 MG CAPS Take 1 capsule by mouth daily. 30 capsule 3  . clotrimazole-betamethasone (LOTRISONE) cream Apply 1 application topically 2 (two) times daily as needed. (Patient not taking: Reported on 08/29/2018) 30 g 0    No Known Allergies  Social History   Socioeconomic History  . Marital status: Single    Spouse name: Not on file  . Number of children: 1  . Years of education: Not on file  . Highest education level: Not on file  Occupational History  . Not on file  Social Needs  . Financial resource strain: Not hard at all   . Food insecurity:    Worry: Never true    Inability: Never true  . Transportation needs:    Medical: No    Non-medical: No  Tobacco Use  . Smoking status: Current Every Day Smoker    Packs/day: 1.00    Types: Cigarettes  . Smokeless tobacco: Never Used  Substance and Sexual Activity  . Alcohol use: No  . Drug use: No    Comment: Past use of MJ and cocaine  . Sexual activity: Yes    Partners: Male    Birth control/protection: Surgical    Comment: BTL  Lifestyle  . Physical activity:    Days per week: 4 days    Minutes per session: 30 min  . Stress: Only a little  Relationships  . Social connections:    Talks on phone: Patient refused    Gets together: Patient refused    Attends religious service: Patient refused    Active member of club or organization: Patient refused    Attends meetings of clubs or organizations: Patient refused    Relationship status: Patient refused  . Intimate partner violence:    Fear of current or ex partner: No    Emotionally abused: No    Physically abused: No    Forced sexual activity: No  Other Topics Concern  . Not on file  Social History Narrative  . Not on file    Family History  Problem Relation Age of Onset  . Depression Mother   . Migraines Mother   . Asthma Father   .  Depression Sister   . Diabetes Paternal Grandmother      Review of Systems  Constitutional: Negative.   HENT: Negative.   Eyes: Negative.   Respiratory: Negative.   Cardiovascular: Negative.   Gastrointestinal: Positive for abdominal pain (ctx per HPI). Negative for blood in stool, constipation, diarrhea, heartburn, melena, nausea and vomiting.  Genitourinary: Negative.   Musculoskeletal: Negative.   Skin: Negative.   Neurological: Negative.   Psychiatric/Behavioral: Negative.      Physical Exam: BP (!) 102/50   Pulse 77   Temp 98 F (36.7 C) (Oral)   Resp 15   Ht 5\' 5"  (1.651 m)   Wt 72.6 kg   LMP 11/18/2017 (Within Days)   BMI 26.63 kg/m    Physical Exam Constitutional:      General: She is not in acute distress.    Appearance: Normal appearance.  HENT:     Head: Normocephalic and atraumatic.  Eyes:     General: No scleral icterus.    Conjunctiva/sclera: Conjunctivae normal.  Neurological:     General: No focal deficit present.     Mental Status: She is alert and oriented to person, place, and time.     Cranial Nerves: No cranial nerve deficit.  Psychiatric:        Mood and Affect: Mood normal.        Behavior: Behavior normal.        Judgment: Judgment normal.   Cervix: very slow change from 2-3 cm.    Consults: None  Significant Findings/ Diagnostic Studies: none  Procedures:  NST Baseline FHR: 120 beats/min Variability: moderate Accelerations: present Decelerations: absent Tocometry: 2-3 q 10 min, patient rates as mild  Interpretation:  INDICATIONS: rule out uterine contractions RESULTS:  A NST procedure was performed with FHR monitoring and a normal baseline established, appropriate time of 20-40 minutes of evaluation, and accels >2 seen w 15x15 characteristics.  Results show a REACTIVE NST.    Hospital Course: The patient was admitted to Labor and Delivery Triage for observation. She was observed for a long time. Her cervix made questionable change (different examiners) from ~2cm to ~3cm. The patient was offered a longer evaluation. However, she wanted to go home.  It was recommended that she stay. However, as she is in prodromal labor, she can easily return to L&D if her contractions worsen.  She had normal vital signs.  The fetal tracing was reassuring. There were sporadic and infrequent variable decelerations, that barely qualified. A bedside ultrasound was performed and the maximum vertical pocket noted was ~4 cm.  The fetus was cephalic in presentation.  She was cautiously discharged home with the idea that she return for even a modest increase in symptoms. She voiced understanding and agreement.    Discharge Condition: stable  Disposition: Discharge disposition: 01-Home or Self Care       Diet: Regular diet  Discharge Activity: Activity as tolerated   Allergies as of 08/29/2018   No Known Allergies     Medication List    STOP taking these medications   clotrimazole-betamethasone cream Commonly known as:  Lotrisone     TAKE these medications   Vitafol FE+ 90-0.6-0.4-200 MG Caps Take 1 capsule by mouth daily.      [place]. Go on 09/02/2018.   Specialty:  Obstetrics and Gynecology Why:  Keep previously scheduled appointment Contact information: [address] [place] [id number] [phone]  Total time spent taking care of this patient: 45 minutes  Signed: Prentice Docker, MD  08/29/2018, 9:13 PM

## 2018-08-29 NOTE — H&P (Signed)
Obstetric H&P   Chief Complaint: Contractions  Prenatal Care Provider: Mosetta Pigeon  History of Present Illness: 28 y.o. [id number] [redacted]w[redacted]d by 09/07/2018, by Ultrasound presenting to L&D for painful contractions that began this morning around 0500. She had a few contractions yesterday, but they were not painful compared to what she is experiencing today. They are coming every 5-15 minutes. She rates the pain as 9/10. Able to talk through contractions. She has tried drinking water and resting with no relief. No loss of fluid or vaginal bleeding. Baby is moving well. No other complaints.   Pregravid weight 54.4 kg Total Weight Gain 18.1 kg  Pregnancy#4 Problems (from 11/18/17 to present)    Problem Noted Resolved   Supervision of other normal pregnancy, antepartum 02/06/2018 by Homero Fellers, MD 06/26/2018 by Gae Dry, MD   Overview Addendum 06/26/2018 11:32 AM by Gae Dry, MD    Clinic [facility] Prenatal Labs  Dating [place] Blood type: O/Positive/-- (09/26 1137)   Genetic Screen NIPS: XY Antibody:Negative (09/26 1137)  Anatomic [place] WS Rubella: <0.90 (09/26 1137) Varicella: I  GTT     Third trimester: 92 RPR: Non Reactive (01/30 1102)   Rhogam na HBsAg: Negative (09/26 1137)   TDaP vaccine         Flu Shot:declined HIV: Non Reactive (01/30 1102)   Baby Food            Breast                    GBS:   Contraception BTL vs other Pap:02/13/2018 normal  CBB     CS/VBAC    Support Person                  Review of Systems: 10 point review of systems negative unless otherwise noted in HPI.  Past Medical History: Past Medical History:  Diagnosis Date  . Anemia   . Anxiety   . Depression   . Migraine     Past Surgical History: Past Surgical History:  Procedure Laterality Date  . 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 Depression Mother   . Migraines  Mother   . Asthma Father   . Depression Sister   . Diabetes Paternal Grandmother     Social History: Social History   Socioeconomic History  . Marital status: Single    Spouse name: Not on file  . Number of children: 1  . Years of education: Not on file  . Highest education level: Not on file  Occupational History  . Not on file  Social Needs  . Financial resource strain: Not hard at all  . Food insecurity:    Worry: Never true    Inability: Never true  . Transportation needs:    Medical: No    Non-medical: No  Tobacco Use  . Smoking status: Current Every Day Smoker    Packs/day: 1.00    Types: Cigarettes  . Smokeless tobacco: Never Used  Substance and Sexual Activity  . Alcohol use: No  . Drug use: No    Comment: Past use of MJ and cocaine  . Sexual activity: Yes    Partners: Male    Birth control/protection: Surgical    Comment: BTL  Lifestyle  . Physical activity:    Days per week: 4 days    Minutes per session: 30 min  .  Stress: Only a little  Relationships  . Social connections:    Talks on phone: Patient refused    Gets together: Patient refused    Attends religious service: Patient refused    Active member of club or organization: Patient refused    Attends meetings of clubs or organizations: Patient refused    Relationship status: Patient refused  . Intimate partner violence:    Fear of current or ex partner: No    Emotionally abused: No    Physically abused: No    Forced sexual activity: No  Other Topics Concern  . Not on file  Social History Narrative  . Not on file    Medications: Prior to Admission medications   Medication Sig Start Date End Date Taking? Authorizing Provider  Prenat-Fe Poly-Methfol-FA-DHA (VITAFOL FE+) 90-0.6-0.4-200 MG CAPS Take 1 capsule by mouth daily. 06/26/18  Yes Gae Dry, MD  clotrimazole-betamethasone (LOTRISONE) cream Apply 1 application topically 2 (two) times daily as needed. Patient not taking: Reported  on 08/29/2018 08/12/18   Dalia Heading, CNM    Allergies: No Known Allergies  Physical Exam: Vitals: Blood pressure 117/67, pulse 83, temperature 98 F (36.7 C), temperature source Oral, resp. rate 15, height 5\' 5"  (1.651 m), weight 72.6 kg, last menstrual period 11/18/2017.   FHT: Baseline 125 bpm, moderate variability, accelerations present, decelerations present: variable Toco: irregular contractions about every 3-10 minutes, mild to moderate  General: NAD HEENT: normocephalic, anicteric Pulmonary: No increased work of breathing, CTAB Cardiovascular: RRR, no murmurs, rubs, or gallops Abdomen: Gravid, non-tender Genitourinary: Cervical exam by RN 2/70-80/-1 Extremities: no edema, erythema, or tenderness Neurologic: Grossly intact Psychiatric: mood appropriate, affect full  Labs: No results found for this or any previous visit (from the past 24 hour(s)).  Assessment: 28 y.o. [id number] [redacted]w[redacted]d by 09/07/2018, by Ultrasound with contractions  Plan: 1) Monitor for cervical change; early labor vs. prodromal labor.  2) Fetus - Category II tracing, reassuring overall, continue to monitor  3) PNL - Blood type O/Positive/-- (09/26 1137) / Anti-bodyscreen Negative (09/26 1137) / Rubella <0.90 (09/26 1137) / Varicella immume / RPR Non Reactive (01/30 1102) / HBsAg Negative (09/26 1137) / HIV Non Reactive (01/30 1102) / 1-hr OGTT 92  / GBS Negative (03/31 1613)  4) Immunization History -  Immunization History  Administered Date(s) Administered  . Tdap 07/16/2018    5) Disposition - Observe in triage for labor.  [name], CNM 08/29/2018

## 2018-08-30 ENCOUNTER — Encounter: Payer: Self-pay | Admitting: *Deleted

## 2018-08-30 ENCOUNTER — Inpatient Hospital Stay
Admission: EM | Admit: 2018-08-30 | Discharge: 2018-08-31 | DRG: 807 | Disposition: A | Discharge status: Home / Self Care | Payer: Medicaid Other | Attending: Certified Nurse Midwife | Admitting: Certified Nurse Midwife

## 2018-08-30 DIAGNOSIS — O99334 Smoking (tobacco) complicating childbirth: Secondary | ICD-10-CM | POA: Diagnosis present

## 2018-08-30 DIAGNOSIS — Z3A38 38 weeks gestation of pregnancy: Secondary | ICD-10-CM | POA: Diagnosis not present

## 2018-08-30 DIAGNOSIS — F17210 Nicotine dependence, cigarettes, uncomplicated: Secondary | ICD-10-CM | POA: Diagnosis present

## 2018-08-30 DIAGNOSIS — O26893 Other specified pregnancy related conditions, third trimester: Secondary | ICD-10-CM | POA: Diagnosis present

## 2018-08-30 LAB — CBC
HCT: 32.5 % — ABNORMAL LOW (ref 36.0–46.0)
Hemoglobin: 11 g/dL — ABNORMAL LOW (ref 12.0–15.0)
MCH: 30 pg (ref 26.0–34.0)
MCHC: 33.8 g/dL (ref 30.0–36.0)
MCV: 88.6 fL (ref 80.0–100.0)
Platelets: 191 10*3/uL (ref 150–400)
RBC: 3.67 MIL/uL — ABNORMAL LOW (ref 3.87–5.11)
RDW: 13.7 % (ref 11.5–15.5)
WBC: 16.9 10*3/uL — ABNORMAL HIGH (ref 4.0–10.5)
nRBC: 0 % (ref 0.0–0.2)

## 2018-08-30 LAB — TYPE AND SCREEN
ABO/RH(D): O POS
Antibody Screen: NEGATIVE

## 2018-08-30 MED ORDER — ALUM & MAG HYDROXIDE-SIMETH 200-200-20 MG/5ML PO SUSP: 30.0000 mL | Freq: Once | ORAL | Status: DC

## 2018-08-30 MED ORDER — WITCH HAZEL-GLYCERIN EX PADS: 1.0000 "application " | MEDICATED_PAD | CUTANEOUS | Status: DC | PRN

## 2018-08-30 MED ORDER — IBUPROFEN 600 MG PO TABS
600.0000 mg | ORAL_TABLET | Freq: Four times a day (QID) | ORAL | Status: DC
  Administered 2018-08-30 – 2018-08-31 (×5): 600 mg via ORAL
  Filled 2018-08-30 (×5): qty 1

## 2018-08-30 MED ORDER — MISOPROSTOL 200 MCG PO TABS
800.0000 ug | ORAL_TABLET | Freq: Once | ORAL | Status: DC | PRN
  Filled 2018-08-30: qty 4

## 2018-08-30 MED ORDER — ONDANSETRON HCL 4 MG/2ML IJ SOLN: 4.0000 mg | INTRAMUSCULAR | Status: DC | PRN

## 2018-08-30 MED ORDER — AMMONIA AROMATIC IN INHA: 0.3000 mL | Freq: Once | RESPIRATORY_TRACT | Status: DC | PRN

## 2018-08-30 MED ORDER — ONDANSETRON HCL 4 MG PO TABS: 4.0000 mg | ORAL_TABLET | ORAL | Status: DC | PRN

## 2018-08-30 MED ORDER — LACTATED RINGERS IV SOLN
INTRAVENOUS | Status: DC
  Administered 2018-08-30: 09:00:00 via INTRAVENOUS

## 2018-08-30 MED ORDER — PRENATAL MULTIVITAMIN CH
1.0000 | ORAL_TABLET | Freq: Every day | ORAL | Status: DC
  Administered 2018-08-31: 1 via ORAL
  Filled 2018-08-30: qty 1

## 2018-08-30 MED ORDER — BUTORPHANOL TARTRATE 2 MG/ML IJ SOLN
1.0000 mg | INTRAMUSCULAR | Status: DC | PRN
  Administered 2018-08-30: 1 mg via INTRAVENOUS
  Filled 2018-08-30: qty 1

## 2018-08-30 MED ORDER — OXYTOCIN 40 UNITS IN NORMAL SALINE INFUSION - SIMPLE MED
2.5000 [IU]/h | INTRAVENOUS | Status: DC
  Filled 2018-08-30: qty 1000

## 2018-08-30 MED ORDER — COCONUT OIL OIL
1.0000 "application " | TOPICAL_OIL | Status: DC | PRN
  Filled 2018-08-30: qty 120

## 2018-08-30 MED ORDER — SIMETHICONE 80 MG PO CHEW: 80.0000 mg | CHEWABLE_TABLET | ORAL | Status: DC | PRN

## 2018-08-30 MED ORDER — ALUM & MAG HYDROXIDE-SIMETH 200-200-20 MG/5ML PO SUSP
ORAL | Status: AC
  Administered 2018-08-30: 30 mL
  Filled 2018-08-30: qty 30

## 2018-08-30 MED ORDER — DIBUCAINE (PERIANAL) 1 % EX OINT: 1.0000 "application " | TOPICAL_OINTMENT | CUTANEOUS | Status: DC | PRN

## 2018-08-30 MED ORDER — OXYTOCIN BOLUS FROM INFUSION
500.0000 mL | Freq: Once | INTRAVENOUS | Status: AC
  Administered 2018-08-30: 500 mL via INTRAVENOUS

## 2018-08-30 MED ORDER — FERROUS SULFATE 325 (65 FE) MG PO TABS
325.0000 mg | ORAL_TABLET | Freq: Every day | ORAL | Status: DC
  Administered 2018-08-31: 325 mg via ORAL
  Filled 2018-08-30: qty 1

## 2018-08-30 MED ORDER — LACTATED RINGERS IV SOLN: 500.0000 mL | INTRAVENOUS | Status: DC | PRN

## 2018-08-30 MED ORDER — BENZOCAINE-MENTHOL 20-0.5 % EX AERO
1.0000 "application " | INHALATION_SPRAY | CUTANEOUS | Status: DC | PRN
  Administered 2018-08-30: 1 via TOPICAL
  Filled 2018-08-30: qty 56

## 2018-08-30 MED ORDER — SENNOSIDES-DOCUSATE SODIUM 8.6-50 MG PO TABS
2.0000 | ORAL_TABLET | ORAL | Status: DC
  Administered 2018-08-31: 2 via ORAL
  Filled 2018-08-30 (×2): qty 2

## 2018-08-30 MED ORDER — LIDOCAINE HCL (PF) 1 % IJ SOLN
30.0000 mL | INTRAMUSCULAR | Status: DC | PRN
  Filled 2018-08-30: qty 30

## 2018-08-30 NOTE — Plan of Care (Signed)
Patient vaginally delivered viable female infant at 1209.

## 2018-08-30 NOTE — Discharge Instructions (Signed)
°Vaginal Delivery, Care After °Refer to this sheet in the next few weeks. These discharge instructions provide you with information on caring for yourself after delivery. Your caregiver may also give you specific instructions. Your treatment has been planned according to the most current medical practices available, but problems sometimes occur. Call your caregiver if you have any problems or questions after you go home. °HOME CARE INSTRUCTIONS °1. Take over-the-counter or prescription medicines only as directed by your caregiver or pharmacist. °2. Do not drink alcohol, especially if you are breastfeeding or taking medicine to relieve pain. °3. Do not smoke tobacco. °4. Continue to use good perineal care. Good perineal care includes: °1. Wiping your perineum from back to front °2. Keeping your perineum clean. °3. You can do sitz baths twice a day, to help keep this area clean °5. Do not use tampons, douche or have sex for 6 weeks °6. Shower only and avoid sitting in submerged water, aside from sitz baths °7. Wear a well-fitting bra that provides breast support. °8. Eat healthy foods. °9. Drink enough fluids to keep your urine clear or pale yellow. °10. Eat high-fiber foods such as whole grain cereals and breads, brown rice, beans, and fresh fruits and vegetables every day. These foods may help prevent or relieve constipation. °11. Avoid constipation with high fiber foods or medications, such as miralax or metamucil °12. Follow your caregiver's recommendations regarding resumption of activities such as climbing stairs, driving, lifting, exercising, or traveling. °13. Talk to your caregiver about resuming sexual activities. Resumption of sexual activities after 6 weeks is dependent upon your risk of infection, your rate of healing, and your comfort and desire to resume sexual activity. °14. Try to have someone help you with your household activities and your newborn for at least a few days after you leave the  hospital. °15. Rest as much as possible. Try to rest or take a nap when your newborn is sleeping. °16. Increase your activities gradually. °17. Keep all of your scheduled postpartum appointments. It is very important to keep your scheduled follow-up appointments. At these appointments, your caregiver will be checking to make sure that you are healing physically and emotionally. °SEEK MEDICAL CARE IF:  °· You are passing large clots from your vagina. Save any clots to show your caregiver. °· You have a foul smelling discharge from your vagina. °· You have trouble urinating. °· You are urinating frequently. °· You have pain when you urinate. °· You have a change in your bowel movements. °· You have increasing redness, pain, or swelling near your vaginal incision (episiotomy) or vaginal tear. °· You have pus draining from your episiotomy or vaginal tear. °· Your episiotomy or vaginal tear is separating. °· You have painful, hard, or reddened breasts. °· You have a severe headache. °· You have blurred vision or see spots. °· You feel sad or depressed. °· You have thoughts of hurting yourself or your newborn. °· You have questions about your care, the care of your newborn, or medicines. °· You are dizzy or light-headed. °· You have a rash. °· You have nausea or vomiting. °· You were breastfeeding and have not had a menstrual period within 12 weeks after you stopped breastfeeding. °· You are not breastfeeding and have not had a menstrual period by the 12th week after delivery. °· You have a fever of 100.5 or more °SEEK IMMEDIATE MEDICAL CARE IF:  °· You have persistent pain. °· You have chest pain. °· You have shortness   of breath. °· You faint. °· You have leg pain. °· You have stomach pain. °· Your vaginal bleeding saturates two or more sanitary pads in 1 hour. °MAKE SURE YOU:  °· Understand these instructions. °· Will watch your condition. °· Will get help right away if you are not doing well or get worse. °Document  Released: 04/27/2000 Document Revised: 09/14/2013 Document Reviewed: 12/26/2011 °ExitCare® Patient Information ©2015 [employer]. This information is not intended to replace advice given to you by your health care provider. Make sure you discuss any questions you have with your health care provider. ° °Sitz Bath °A sitz bath is a warm water bath taken in the sitting position. The water covers only the hips and butt (buttocks). We recommend using one that fits in the toilet, to help with ease of use and cleanliness. It may be used for either healing or cleaning purposes. Sitz baths are also used to relieve pain, itching, or muscle tightening (spasms). The water may contain medicine. Moist heat will help you heal and relax.  °HOME CARE  °Take 3 to 4 sitz baths a day. °18. Fill the bathtub half-full with warm water. °19. Sit in the water and open the drain a little. °20. Turn on the warm water to keep the tub half-full. Keep the water running constantly. °21. Soak in the water for 15 to 20 minutes. °22. After the sitz bath, pat the affected area dry. °GET HELP RIGHT AWAY IF: °You get worse instead of better. Stop the sitz baths if you get worse. °MAKE SURE YOU: °· Understand these instructions. °· Will watch your condition. °· Will get help right away if you are not doing well or get worse. °Document Released: 06/07/2004 Document Revised: 01/23/2012 Document Reviewed: 08/28/2010 °ExitCare® Patient Information ©2015 [employer]. This information is not intended to replace advice given to you by your health care provider. Make sure you discuss any questions you have with your health care provider. ° ° °

## 2018-08-30 NOTE — H&P (Signed)
Obstetric H&P   Chief Complaint: Contractions  Prenatal Care Provider: Mosetta Pigeon  History of Present Illness: 28 y.o. [id number] WF at 71w6dgestation by 09/07/2018, by 10wk4d Ultrasound presenting to L&D for painful contractions that increased in intensity and frequency at 0630 this AM becoming every 3-4 min apart. She was seen yesterday in L&D with prodromal labor and was discharged when she made no further progress past 3-4 cm.  No reports losing her mucous plug and having some bloody show. Baby is moving well.    Pregravid weight 54.4 kg Total Weight Gain 18.1 kg  Pregnancy#4 Problems (from 11/18/17 to present)    Problem Noted Resolved   Supervision of other normal pregnancy, antepartum 02/06/2018 by SHomero Fellers MD 06/26/2018 by HGae Dry MD   Overview Addendum 06/26/2018 11:32 AM by HGae Dry MD    Clinic [facility] Prenatal Labs  Dating UKoreaBlood type: O/Positive/-- (09/26 1137)   Genetic Screen NIPS: XY Antibody:Negative (09/26 1137)  Anatomic UKoreaWS Rubella: <0.90 (09/26 1137) Varicella: I  GTT     Third trimester: 92 RPR: Non Reactive (01/30 1102)   Rhogam na HBsAg: Negative (09/26 1137)   TDaP vaccine     07/16/18    Flu Shot:declined HIV: Non Reactive (01/30 1102)   Baby Food            Breast                    GBS: negative  Contraception BTL vs other Pap:02/13/2018 normal  CBB     CS/VBAC    Support Person                  Review of Systems: 10 point review of systems negative unless otherwise noted in HPI.  Past Medical History: Past Medical History:  Diagnosis Date  . Anemia   . Anxiety   . Depression   . Migraine     Past Surgical History: Past Surgical History:  Procedure Laterality Date  . 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 Depression Mother   . Migraines Mother   . Asthma Father   . Depression Sister   .  Diabetes Paternal Grandmother     Social History: Social History   Socioeconomic History  . Marital status: Single    Spouse name: Not on file  . Number of children: 1  . Years of education: Not on file  . Highest education level: Not on file  Occupational History  . Not on file  Social Needs  . Financial resource strain: Not hard at all  . Food insecurity:    Worry: Never true    Inability: Never true  . Transportation needs:    Medical: No    Non-medical: No  Tobacco Use  . Smoking status: Current Every Day Smoker    Packs/day: 1.00    Types: Cigarettes  . Smokeless tobacco: Never Used  Substance and Sexual Activity  . Alcohol use: No  . Drug use: No    Comment: Past use of MJ and cocaine  . Sexual activity: Yes    Partners: Male    Birth control/protection: Surgical    Comment: BTL  Lifestyle  . Physical activity:    Days per week: 4 days    Minutes per session: 30 min  . Stress: Only a little  Relationships  .  Social connections:    Talks on phone: Patient refused    Gets together: Patient refused    Attends religious service: Patient refused    Active member of club or organization: Patient refused    Attends meetings of clubs or organizations: Patient refused    Relationship status: Patient refused  . Intimate partner violence:    Fear of current or ex partner: No    Emotionally abused: No    Physically abused: No    Forced sexual activity: No  Other Topics Concern  . Not on file  Social History Narrative  . Not on file    Medications: Prior to Admission medications   Medication Sig Start Date End Date Taking? Authorizing Provider  Prenat-Fe Poly-Methfol-FA-DHA (VITAFOL FE+) 90-0.6-0.4-200 MG CAPS Take 1 capsule by mouth daily. 06/26/18  Yes Gae Dry, MD           Allergies: No Known Allergies  Physical Exam: Vitals:BP 114/75   Pulse 90   Temp 98.1 F (36.7 C) (Oral)   Resp 17   Ht _0  (1.651 m)   Wt 72.6 kg   LMP 11/18/2017  (Within Days)   BMI 26.63 kg/m   General: WF gravid,  breathing thru contractions HEENT: normocephalic, anicteric Pulmonary: Normal respiratory effort between contractions, some scattered wheezes Cardiovascular: RRR, no murmurs, rubs, or gallops Abdomen: Gravid, tender with contractions, cephalic, 6# EFW Genitourinary: Cervical exam by RN 6-7/80%/-1 to 0 Extremities: no edema, erythema, or tenderness Neurologic: Grossly intact, +1 DTRs Psychiatric: mood appropriate, affect full  FHR: 130 baseline with accelerations and questionable variable decelerations with contractions vs ultrasound picking up mother's pulse Toco: contractions every 4-6 minutes apart  Assessment: 28 y.o. [id number] 25w6dby 09/07/2018 in active labor  Plan: 1)Admit to L&D-anticipate vaginal delivery  2) Fetus - Category II tracing, reassuring overall, continue to monitor  3) GBS negative  4) O POS/ RNI/ VI-vaccinate with MMR postpartum  5) Immunization History -  Immunization History  Administered Date(s) Administered  . Tdap 07/16/2018    6) Contraception: desires BTL, but due to COVID 19 pandemic, elective surgeries are postponed. Will discuss options  7) Breast  CDalia Heading [place]4/18/2020

## 2018-08-30 NOTE — Plan of Care (Signed)
Admission questions answered and care plans initiated.

## 2018-08-30 NOTE — Lactation Note (Signed)
This note was copied from a baby's chart. Lactation Consultation Note  Patient Name: Toni Blake HCSPZ'Z Date: 08/30/2018   Observed breastfeeding in birthplace with strong rhythmic sucking and occasional swallow.  Mom is experienced breast feeder with successful breast feeding for 4 months with other 2.  Reviewed supply and demand, normal course of lactation and routine newborn feeding patterns.  Maternal Data Formula Feeding for Exclusion: No Has patient been taught Hand Expression?: Yes Does the patient have breastfeeding experience prior to this delivery?: Yes  Feeding Feeding Type: Breast Fed  LATCH Score                   Interventions    Lactation Tools Discussed/Used     Consult Status      Jarold Motto 08/30/2018, 7:56 PM

## 2018-08-30 NOTE — Discharge Summary (Signed)
Physician Obstetric Discharge Summary  Patient ID: Toni Blake MRN: [id number] DOB/AGE: 12-30-1990 28 y.o.   Date of Admission: 08/30/2018 Date of Delivery: 08/30/2018 Date of Discharge: 08/31/2018  Admitting Diagnosis: Onset of Labor at [redacted]w[redacted]d Secondary Diagnosis: none  Mode of Delivery: normal spontaneous vaginal delivery 08/30/2018      Discharge Diagnosis: Term intrauterine pregnancy delivered   Intrapartum Procedures: Atificial rupture of membranes   Post partum procedures: MMR  Complications: none   Brief Hospital Course  Toni DELKERis a [id number] had a SVD on 08/30/2018;  for further details of this delivery, please refer to the delivery note.  Patient had an uncomplicated postpartum course.  By time of discharge on PPD#1, her pain was controlled on oral pain medications; she had appropriate lochia and was ambulating, voiding without difficulty and tolerating regular diet.  She was deemed stable for discharge to home.    Labs: CBC Latest Ref Rng & Units 08/31/2018 08/30/2018 06/12/2018  WBC 4.0 - 10.5 K/uL 18.9(H) 16.9(H) 14.2(H)  Hemoglobin 12.0 - 15.0 g/dL 10.1(L) 11.0(L) 10.1(L)  Hematocrit 36.0 - 46.0 % 30.4(L) 32.5(L) 29.6(L)  Platelets 150 - 400 K/uL 175 191 230   O POS  Physical exam:  Blood pressure (!) 100/57, pulse 74, temperature 97.9 F (36.6 C), temperature source Oral, resp. rate 20, height 5' 5" (1.651 m), weight 72.6 kg, last menstrual period 11/18/2017, SpO2 100 %,currently breastfeeding. General: alert and no distress Lochia: appropriate Abdomen: soft, NT Uterine Fundus: firm/ U-1/ML/NT Extremities: No evidence of DVT seen on physical exam. No lower extremity edema.  Discharge Instructions: Per After Visit Summary. Activity: Advance as tolerated. Pelvic rest for 6 weeks.  Also refer to Discharge Instructions Diet: Regular Medications: Allergies as of 08/31/2018   No Known Allergies     Medication List    TAKE these medications    ibuprofen 600 MG tablet Commonly known as:  ADVIL Take 1 tablet (600 mg total) by mouth every 6 (six) hours as needed for mild pain, moderate pain or cramping.   Vitafol FE+ 90-0.6-0.4-200 MG Caps Take 1 capsule by mouth daily.      Outpatient follow up:  Follow-up Information    GDalia Heading CNM Follow up.   Specialty:  Certified Nurse Midwife Why:  will call to schedule telephone follow/ depression check up in 2 weeks Contact information: [phone]         Postpartum contraception: undecided; signed papers for BTL-elective surgeries postponed until after 1 June due to Covid 19 pandemic  Discharged Condition: good  Discharged to: home   Newborn Data: KDellie Catholic6#8oz Disposition:home with mother  Apgars: APGAR (1 MIN): 9   APGAR (5 MINS): 9   APGAR (10 MINS):    Baby Feeding: Breast  CDalia Heading [place]4/19/2020 11:01 AM

## 2018-08-31 LAB — CBC
HCT: 30.4 % — ABNORMAL LOW (ref 36.0–46.0)
Hemoglobin: 10.1 g/dL — ABNORMAL LOW (ref 12.0–15.0)
MCH: 29.8 pg (ref 26.0–34.0)
MCHC: 33.2 g/dL (ref 30.0–36.0)
MCV: 89.7 fL (ref 80.0–100.0)
Platelets: 175 10*3/uL (ref 150–400)
RBC: 3.39 MIL/uL — ABNORMAL LOW (ref 3.87–5.11)
RDW: 13.7 % (ref 11.5–15.5)
WBC: 18.9 10*3/uL — ABNORMAL HIGH (ref 4.0–10.5)
nRBC: 0 % (ref 0.0–0.2)

## 2018-08-31 MED ORDER — MEASLES, MUMPS & RUBELLA VAC IJ SOLR
0.5000 mL | Freq: Once | INTRAMUSCULAR | Status: AC
  Administered 2018-08-31: 0.5 mL via SUBCUTANEOUS
  Filled 2018-08-31: qty 0.5

## 2018-08-31 MED ORDER — IBUPROFEN 600 MG PO TABS: 600.0000 mg | ORAL_TABLET | Freq: Four times a day (QID) | ORAL | 1 refills | Status: DC | PRN

## 2018-08-31 NOTE — Progress Notes (Signed)
Pt discharged with infant.  Discharge instructions, prescriptions and follow up appointment given to and reviewed with pt. Pt verbalized understanding. Escorted out by staff. 

## 2018-09-01 LAB — RPR: RPR Ser Ql: NONREACTIVE

## 2018-09-02 ENCOUNTER — Encounter: Payer: Medicaid Other | Admitting: Certified Nurse Midwife

## 2018-09-19 ENCOUNTER — Other Ambulatory Visit: Payer: Self-pay

## 2018-09-19 ENCOUNTER — Encounter: Payer: Self-pay | Admitting: Certified Nurse Midwife

## 2018-09-19 ENCOUNTER — Ambulatory Visit (INDEPENDENT_AMBULATORY_CARE_PROVIDER_SITE_OTHER): Payer: Medicaid Other | Admitting: Certified Nurse Midwife

## 2018-09-19 DIAGNOSIS — Z8659 Personal history of other mental and behavioral disorders: Secondary | ICD-10-CM

## 2018-09-20 ENCOUNTER — Encounter: Payer: Self-pay | Admitting: Certified Nurse Midwife

## 2018-09-20 NOTE — Progress Notes (Signed)
Virtual Visit via Telephone Note  I connected with Toni Blake on 09/20/18 at  9:50 AM EDT by telephone and verified that I am speaking with the correct person using two identifiers.   I discussed the limitations, risks, security and privacy concerns of performing an evaluation and management service by telephone and the availability of in person appointments. I also discussed with the patient that there may be a patient responsible charge related to this service. The patient expressed understanding and agreed to proceed.  The patient was at home I spoke with the patient from my  Work station [employer] of people involved in this encounter were: Toni Blake , and Toni Blake , and myself .   History of Present Illness: 11 y.I.[id number]  WF who had a SVD of a 6#8oz baby boy on 08/30/2018. She has a past history of anxiety and depression. She is called today to see how she is doing emotionally, physically postpartum.  During our 7 minute conversation, she denied feeling sad or crying frequently, but does admit to feeling over whelmed at times with 3 children at home. She reports that her husband has been a good support. Has not had any problems otherwise with caring for Toni Blake. He is eating well.  Denies any panic attacks. Physically, she reports that her bleeding has basically stopped. No abdominal pain. No fever.   Observations/Objective: Physical Exam could not be performed. Because of the COVID-19 outbreak this visit was performed over the phone and not in person.   Assessment and Plan: Postpartum follow up appointment Appears patient is doing well emotionally and physically  Follow Up Instructions:  Will make 6 week postpartum check up appointment and let me know of any problems prior to that. I discussed the assessment and treatment plan with the patient. The patient was provided an opportunity to ask questions and all were answered. The patient agreed with the plan and demonstrated an  understanding of the instructions.    I provided 7 minutes of non-face-to-face time during this encounter.  Dalia Heading, [facility], [facility] 09/20/2018 9:48 AM

## 2018-10-12 NOTE — Progress Notes (Signed)
Postpartum Visit  Chief Complaint:  Chief Complaint  Patient presents with  . Postpartum Follow-up    no complaints    History of Present Illness: Toni Blake is a 28 y.o. WF [id number] who presents for her 6 week postpartum visit. Has been doing well. Her partner is working during [employer] a Development worker, community in [place]. Has been home schooling 26 year old and caring for 26 year old.  Date of delivery: 08/30/2018 Type of delivery: Vaginal delivery - Vacuum or forceps assisted  no Episiotomy No.  Laceration: no  Pregnancy or labor problems:  No. (hx of anxiety and depression, but no problems with this perinatally). Tobacco smoking Any problems since the delivery:  no  Newborn Details:  SINGLETON :  1. Baby's name: Dellie Catholic. Birth weight: 6#8oz Maternal Details:  Breast Feeding:  yes Post partum depression/anxiety noted:  no Edinburgh Post-Partum Depression Score:  3  Date of last PAP: 02/13/2018  NIL   Review of Systems: Review of Systems  Constitutional: Negative for chills, fever and weight loss.  HENT: Negative for congestion, sinus pain and sore throat.   Eyes: Negative for blurred vision and pain.  Respiratory: Negative for hemoptysis, shortness of breath and wheezing.   Cardiovascular: Negative for chest pain, palpitations and leg swelling.  Gastrointestinal: Negative for abdominal pain, blood in stool, diarrhea, heartburn, nausea and vomiting.  Genitourinary: Negative for dysuria, frequency, hematuria and urgency.  Musculoskeletal: Negative for back pain, joint pain and myalgias.  Skin: Negative for itching and rash.  Neurological: Negative for dizziness, tingling and headaches.  Endo/Heme/Allergies: Negative for environmental allergies and polydipsia. Does not bruise/bleed easily.       Negative for hirsutism   Psychiatric/Behavioral: Negative for depression. The patient is not nervous/anxious and does not have insomnia.     Past Medical History:  Past  Medical History:  Diagnosis Date  . Anemia   . Anxiety   . Depression   . Migraine     Past Surgical History:  Past Surgical History:  Procedure Laterality Date  . 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 Depression Mother   . Migraines Mother   . Asthma Father   . Depression Sister   . Diabetes Paternal Grandmother     Social History:  Social History   Socioeconomic History  . Marital status: Single    Spouse name: Not on file  . Number of children: 3  . Years of education: Not on file  . Highest education level: Not on file  Occupational History  . Not on file  Social Needs  . Financial resource strain: Not hard at all  . Food insecurity:    Worry: Never true    Inability: Never true  . Transportation needs:    Medical: No    Non-medical: No  Tobacco Use  . Smoking status: Current Every Day Smoker    Packs/day: 1.00    Types: Cigarettes  . Smokeless tobacco: Never Used  Substance and Sexual Activity  . Alcohol use: No  . Drug use: No    Comment: Past use of MJ and cocaine  . Sexual activity: Not Currently    Partners: Male    Birth control/protection: Surgical    Comment: BTL  Lifestyle  . Physical activity:    Days per week: 4 days    Minutes per session: 30 min  . Stress: Only  a little  Relationships  . Social connections:    Talks on phone: Patient refused    Gets together: Patient refused    Attends religious service: Patient refused    Active member of club or organization: Patient refused    Attends meetings of clubs or organizations: Patient refused    Relationship status: Patient refused  . Intimate partner violence:    Fear of current or ex partner: No    Emotionally abused: No    Physically abused: No    Forced sexual activity: No  Other Topics Concern  . Not on file  Social History Narrative  . Not on file    Allergies:  No Known Allergies   Medications: Prior to Admission medications   Medication Sig Start Date End Date Taking? Authorizing Provider  Prenat-Fe Poly-Methfol-FA-DHA (VITAFOL FE+) 90-0.6-0.4-200 MG CAPS Take 1 capsule by mouth daily. 06/26/18   Gae Dry, MD    Physical Exam Vitals: BP (!) 100/58 (BP Location: Right Arm, Patient Position: Sitting, Cuff Size: Normal)   Pulse 98   Ht 5\' 5"  (1.651 m)   Wt 149 lb (67.6 kg)   LMP 08/30/2018   Breastfeeding Yes   BMI 24.79 kg/m    General: WF in NAD, holding sleeping baby HEENT: normocephalic, anicteric Neck: No thyroid enlargement, no palpable nodules, no cervical lymphadenpathy Breast: Lactating, no inflammation, no masses, nipples intact Heart: RRR without murmur Pulmonary: No increased work of breathing, CTAB Abdomen: Soft, non-tender, non-distended.  Umbilicus without lesions.  No hepatomegaly or masses palpable. No evidence of hernia. Genitourinary:  External: Well healed perineum, no lesions or inflammation    Vagina: Normal vaginal mucosa, no evidence of prolapse.    Cervix: Grossly normal in appearance, no bleeding  Uterus: AV, well involuted, mobile, non-tender  Adnexa: No adnexal masses, non-tender  Rectal: deferred Extremities: no edema, erythema, or tenderness Neurologic: Grossly intact Psychiatric: mood appropriate, affect full  Hemoglobin 12.4 gm/dl.     Assessment: 28 y.o. [id number] presenting for 6 week postpartum visit-normal involution  Plan:  1) Contraception : desired BTL, but not scheduling routine surgery at this time. Desires to start POP while awaiting surgery. RX for Errin #1/ RF x 11. Placed name on waiting list for BTL  2)  Pap not due until after 02/14/2019  .3) Patient underwent screening for postpartum depression with no concerns noted.  4) Discussed return to normal activity, recommend continuing prenatal vitamins. Note for work to resume in 2 weeks.  5) Follow up 1 year for routine annual exam.   Dalia Heading, CNM

## 2018-10-13 ENCOUNTER — Other Ambulatory Visit: Payer: Self-pay

## 2018-10-13 ENCOUNTER — Ambulatory Visit (INDEPENDENT_AMBULATORY_CARE_PROVIDER_SITE_OTHER): Payer: Medicaid Other | Admitting: Certified Nurse Midwife

## 2018-10-13 ENCOUNTER — Encounter: Payer: Self-pay | Admitting: Certified Nurse Midwife

## 2018-10-13 VITALS — BP 100/58 | HR 98 | Ht 65.0 in | Wt 149.0 lb

## 2018-10-13 DIAGNOSIS — D62 Acute posthemorrhagic anemia: Secondary | ICD-10-CM

## 2018-10-13 DIAGNOSIS — Z1389 Encounter for screening for other disorder: Secondary | ICD-10-CM | POA: Diagnosis not present

## 2018-10-13 LAB — POCT HEMOGLOBIN: Hemoglobin: 12.4 g/dL (ref 11–14.6)

## 2018-10-13 MED ORDER — NORETHINDRONE 0.35 MG PO TABS: 1.0000 | ORAL_TABLET | Freq: Every day | ORAL | 11 refills | Status: DC

## 2018-11-20 ENCOUNTER — Telehealth: Payer: Self-pay | Admitting: Obstetrics and Gynecology

## 2018-11-20 NOTE — Telephone Encounter (Signed)
-----   Message from Will Bonnet, MD sent at 11/07/2018  4:39 PM EDT ----- Regarding: Schedule surgery Surgery Booking Request Patient Full Name:  Toni Blake  MRN: [id number]  DOB: 01/07/1991  Surgeon: Prentice Docker, MD  Requested Surgery Date and Time: TBD (as soon as possible) Primary Diagnosis AND Code: Desires permanent sterility Secondary Diagnosis and Code:  Surgical Procedure: laparoscopic bilateral tubal ligation L&D Notification: No Admission Status: same day surgery Length of Surgery: 40 minutes Special Case Needs: Filshie Clips H&P: TBD (date) Phone Interview???: yes Interpreter: Language:  Medical Clearance: no Special Scheduling Instructions: Need to verify medicaid status Acuity: P3

## 2018-11-20 NOTE — Telephone Encounter (Signed)
Lmtrc

## 2018-12-03 NOTE — Telephone Encounter (Signed)
Patient is aware of H&P on 12/02/18 @ 2:10pm w/ Dr. Glennon Mac, Pre-admit testing phone interview and covid testing to be scheduled, and OR on 12/18/18.

## 2018-12-05 ENCOUNTER — Ambulatory Visit (INDEPENDENT_AMBULATORY_CARE_PROVIDER_SITE_OTHER): Payer: Medicaid Other | Admitting: Obstetrics and Gynecology

## 2018-12-05 ENCOUNTER — Other Ambulatory Visit: Payer: Self-pay

## 2018-12-05 ENCOUNTER — Encounter: Payer: Self-pay | Admitting: Obstetrics and Gynecology

## 2018-12-05 VITALS — BP 124/78 | Ht 65.0 in | Wt 150.0 lb

## 2018-12-05 DIAGNOSIS — Z302 Encounter for sterilization: Secondary | ICD-10-CM

## 2018-12-05 DIAGNOSIS — Z3009 Encounter for other general counseling and advice on contraception: Secondary | ICD-10-CM | POA: Diagnosis not present

## 2018-12-05 NOTE — Progress Notes (Signed)
Preoperative History and Physical  Toni Blake is a 28 y.o. [phone] here for surgical management of desire for sterilization.   No significant preoperative concerns.  History of Present Illness: 28 y.o. [phone] female who desires sterilization.  She is 093% certain that she would like a permanent form of sterilization. She signed her medicaid consent form on 07/16/2018.   Proposed surgery: laparoscopic bilateral tubal ligation.   Past Medical History:  Diagnosis Date  . Anemia   . Anxiety   . Depression   . Migraine    Past Surgical History:  Procedure Laterality Date  . 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   Current Outpatient Medications on File Prior to Visit  Medication Sig Dispense Refill  . norethindrone (MICRONOR) 0.35 MG tablet Take 1 tablet (0.35 mg total) by mouth daily. (Patient not taking: Reported on 12/05/2018) 1 Package 11  . Prenat-Fe Poly-Methfol-FA-DHA (VITAFOL FE+) 90-0.6-0.4-200 MG CAPS Take 1 capsule by mouth daily. (Patient not taking: Reported on 12/05/2018) 30 capsule 3   No current facility-administered medications on file prior to visit.    No Known Allergies  Social History:   reports that she has been smoking cigarettes. She has been smoking about 1.00 pack per day. She has never used smokeless tobacco. She reports that she does not drink alcohol or use drugs.  Family History  Problem Relation Age of Onset  . Depression Mother   . Migraines Mother   .  Asthma Father   . Depression Sister   . Diabetes Paternal Grandmother     Review of Systems: Noncontributory  PHYSICAL EXAM: Blood pressure 124/78, height 5\' 5"  (1.651 m), weight 150 lb (68 kg), currently breastfeeding. CONSTITUTIONAL: Well-developed, well-nourished female in no acute distress.  HENT:  Normocephalic, atraumatic, External right and left ear normal. Oropharynx is clear and moist EYES: Conjunctivae and EOM are normal. Pupils are equal, round, and reactive to light. No scleral icterus.  NECK: Normal range of motion, supple, no masses SKIN: Skin is warm and dry. No rash noted. Not diaphoretic. No erythema. No pallor. [place]: Alert and oriented to person, place, and time. Normal reflexes, muscle tone coordination. No cranial nerve deficit noted. PSYCHIATRIC: Normal mood and affect. Normal behavior. Normal judgment and thought content. CARDIOVASCULAR: Normal heart rate noted, regular rhythm RESPIRATORY: Effort and breath sounds normal, no problems with respiration noted ABDOMEN: Soft, nontender, nondistended. PELVIC: Deferred MUSCULOSKELETAL: Normal range of motion. No edema and no tenderness. 2+ distal pulses.  Labs: No results found for this or any previous visit (from the past 336 hour(s)).  Imaging Studies: No results found.  Assessment:   ICD-10-CM   1. Request for sterilization  Z30.2      Plan: Patient will undergo surgical management with the above  procedure.   The risks of surgery were discussed in detail with the patient including but not limited to: bleeding which may require transfusion or reoperation; infection which may require antibiotics; injury to surrounding organs which may involve bowel, bladder, ureters ; need for additional procedures including laparoscopy or laparotomy; thromboembolic phenomenon, surgical site problems and other postoperative/anesthesia complications. Likelihood of success in alleviating the patient's condition was discussed.  Routine postoperative instructions will be reviewed with the patient and her family in detail after surgery.  The patient concurred with the proposed plan, giving informed written consent for the surgery.  Preoperative prophylactic antibiotics, as indicated, and SCDs ordered on call to the OR.    28 y.o. [id number]  with undesired fertility, desires permanent sterilization.  Other reversible forms of contraception were discussed with patient; she declines all other modalities. Permanent nature of as well as associated risks of the procedure discussed with patient including but not limited to: risk of regret, permanence of method, bleeding, infection, injury to surrounding organs and need for additional procedures.  Failure risk of 0.5-1% with increased risk of ectopic gestation if pregnancy occurs was also discussed with patient.    Prentice Docker, MD 12/05/2018 2:56 PM

## 2018-12-05 NOTE — H&P (View-Only) (Signed)
Preoperative History and Physical  Toni Blake is a 28 y.o. [phone] here for surgical management of desire for sterilization.   No significant preoperative concerns.  History of Present Illness: 28 y.o. [phone] female who desires sterilization.  She is 631% certain that she would like a permanent form of sterilization. She signed her medicaid consent form on 07/16/2018.   Proposed surgery: laparoscopic bilateral tubal ligation.   Past Medical History:  Diagnosis Date  . Anemia   . Anxiety   . Depression   . Migraine    Past Surgical History:  Procedure Laterality Date  . 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   Current Outpatient Medications on File Prior to Visit  Medication Sig Dispense Refill  . norethindrone (MICRONOR) 0.35 MG tablet Take 1 tablet (0.35 mg total) by mouth daily. (Patient not taking: Reported on 12/05/2018) 1 Package 11  . Prenat-Fe Poly-Methfol-FA-DHA (VITAFOL FE+) 90-0.6-0.4-200 MG CAPS Take 1 capsule by mouth daily. (Patient not taking: Reported on 12/05/2018) 30 capsule 3   No current facility-administered medications on file prior to visit.    No Known Allergies  Social History:   reports that she has been smoking cigarettes. She has been smoking about 1.00 pack per day. She has never used smokeless tobacco. She reports that she does not drink alcohol or use drugs.  Family History  Problem Relation Age of Onset  . Depression Mother   . Migraines Mother   .  Asthma Father   . Depression Sister   . Diabetes Paternal Grandmother     Review of Systems: Noncontributory  PHYSICAL EXAM: Blood pressure 124/78, height 5\' 5"  (1.651 m), weight 150 lb (68 kg), currently breastfeeding. CONSTITUTIONAL: Well-developed, well-nourished female in no acute distress.  HENT:  Normocephalic, atraumatic, External right and left ear normal. Oropharynx is clear and moist EYES: Conjunctivae and EOM are normal. Pupils are equal, round, and reactive to light. No scleral icterus.  NECK: Normal range of motion, supple, no masses SKIN: Skin is warm and dry. No rash noted. Not diaphoretic. No erythema. No pallor. [name]: Alert and oriented to person, place, and time. Normal reflexes, muscle tone coordination. No cranial nerve deficit noted. PSYCHIATRIC: Normal mood and affect. Normal behavior. Normal judgment and thought content. CARDIOVASCULAR: Normal heart rate noted, regular rhythm RESPIRATORY: Effort and breath sounds normal, no problems with respiration noted ABDOMEN: Soft, nontender, nondistended. PELVIC: Deferred MUSCULOSKELETAL: Normal range of motion. No edema and no tenderness. 2+ distal pulses.  Labs: No results found for this or any previous visit (from the past 336 hour(s)).  Imaging Studies: No results found.  Assessment:   ICD-10-CM   1. Request for sterilization  Z30.2      Plan: Patient will undergo surgical management with the above  procedure.   The risks of surgery were discussed in detail with the patient including but not limited to: bleeding which may require transfusion or reoperation; infection which may require antibiotics; injury to surrounding organs which may involve bowel, bladder, ureters ; need for additional procedures including laparoscopy or laparotomy; thromboembolic phenomenon, surgical site problems and other postoperative/anesthesia complications. Likelihood of success in alleviating the patient's condition was discussed.  Routine postoperative instructions will be reviewed with the patient and her family in detail after surgery.  The patient concurred with the proposed plan, giving informed written consent for the surgery.  Preoperative prophylactic antibiotics, as indicated, and SCDs ordered on call to the OR.    28 y.o. [id number]  with undesired fertility, desires permanent sterilization.  Other reversible forms of contraception were discussed with patient; she declines all other modalities. Permanent nature of as well as associated risks of the procedure discussed with patient including but not limited to: risk of regret, permanence of method, bleeding, infection, injury to surrounding organs and need for additional procedures.  Failure risk of 0.5-1% with increased risk of ectopic gestation if pregnancy occurs was also discussed with patient.    Prentice Docker, MD 12/05/2018 2:56 PM

## 2018-12-11 ENCOUNTER — Other Ambulatory Visit: Payer: Self-pay

## 2018-12-11 ENCOUNTER — Encounter
Admission: RE | Admit: 2018-12-11 | Discharge: 2018-12-11 | Disposition: A | Discharge status: Home / Self Care | Payer: Medicaid Other | Source: Ambulatory Visit | Attending: Obstetrics and Gynecology | Admitting: Obstetrics and Gynecology

## 2018-12-11 HISTORY — DX: Family history of other specified conditions: Z84.89

## 2018-12-11 NOTE — Patient Instructions (Signed)
Your procedure is scheduled on: 12/18/18 Report to Day Surgery.[facility]OND FLOOR To find out your arrival time please call [phone] between 1PM - 3PM on 12/17/18.  Remember: Instructions that are not followed completely may result in serious medical risk,  up to and including death, or upon the discretion of your surgeon and anesthesiologist your  surgery may need to be rescheduled.     _X__ 1. Do not eat food after midnight the night before your procedure.                 No gum chewing or hard candies. You may drink clear liquids up to 2 hours                 before you are scheduled to arrive for your surgery- DO not drink clear                 liquids within 2 hours of the start of your surgery.                 Clear Liquids include:  water, apple juice without pulp, clear carbohydrate                 drink such as Clearfast of Gatorade, Black Coffee or Tea (Do not add                 anything to coffee or tea).  FINISH CARB DRINK 3 FULL HOURS BEFORE ARRIVAL AT HOSPITAL DAY OF SURGERY  __X__2.  On the morning of surgery brush your teeth with toothpaste and water, you                may rinse your mouth with mouthwash if you wish.  Do not swallow any toothpaste of mouthwash.     _X__ 3.  No Alcohol for 24 hours before or after surgery.   _X__ 4.  Do Not Smoke or use e-cigarettes For 24 Hours Prior to Your Surgery.                 Do not use any chewable tobacco products for at least 6 hours prior to                 surgery.  ____  5.  Bring all medications with you on the day of surgery if instructed.   _X___  6.  Notify your doctor if there is any change in your medical condition      (cold, fever, infections).     Do not wear jewelry, make-up, hairpins, clips or nail polish. Do not wear lotions, powders, or perfumes. You may wear deodorant. Do not shave 48 hours prior to surgery. Men may shave face and neck. Do not bring valuables to the  hospital.    [facility] is not responsible for any belongings or valuables.  Contacts, dentures or bridgework may not be worn into surgery. Leave your suitcase in the car. After surgery it may be brought to your room. For patients admitted to the hospital, discharge time is determined by your treatment team.   Patients discharged the day of surgery will not be allowed to drive home.   Please read over the following fact sheets that you were given:   Surgical Site Infection Prevention          ____ Take these medicines the morning of surgery with A SIP OF WATER:    1. NONE  2.   3.  4.  5.  6.  ____ Fleet Enema (as directed)   _X___ Use CHG Soap as directed  ____ Use inhalers on the day of surgery  ____ Stop metformin 2 days prior to surgery    ____ Take 1/2 of usual insulin dose the night before surgery. No insulin the morning          of surgery.   ____ Stop Coumadin/Plavix/aspirin on   ____ Stop Anti-inflammatories on    ____ Stop supplements until after surgery.    ____ Bring C-Pap to the hospital.

## 2018-12-15 ENCOUNTER — Other Ambulatory Visit: Payer: Self-pay

## 2018-12-15 ENCOUNTER — Other Ambulatory Visit
Admission: RE | Admit: 2018-12-15 | Discharge: 2018-12-15 | Disposition: A | Discharge status: Home / Self Care | Payer: Medicaid Other | Source: Ambulatory Visit | Attending: Obstetrics and Gynecology | Admitting: Obstetrics and Gynecology

## 2018-12-15 DIAGNOSIS — Z01812 Encounter for preprocedural laboratory examination: Secondary | ICD-10-CM | POA: Insufficient documentation

## 2018-12-15 DIAGNOSIS — Z20828 Contact with and (suspected) exposure to other viral communicable diseases: Secondary | ICD-10-CM | POA: Insufficient documentation

## 2018-12-15 LAB — SARS CORONAVIRUS 2 (TAT 6-24 HRS): SARS Coronavirus 2: NEGATIVE

## 2018-12-15 LAB — TYPE AND SCREEN
ABO/RH(D): O POS
Antibody Screen: NEGATIVE

## 2018-12-18 ENCOUNTER — Ambulatory Visit: Payer: Medicaid Other | Admitting: Certified Registered"

## 2018-12-18 ENCOUNTER — Ambulatory Visit
Admission: RE | Admit: 2018-12-18 | Discharge: 2018-12-18 | Disposition: A | Discharge status: Home / Self Care | Payer: Medicaid Other | Attending: Obstetrics and Gynecology | Admitting: Obstetrics and Gynecology

## 2018-12-18 ENCOUNTER — Encounter: Payer: Self-pay | Admitting: Emergency Medicine

## 2018-12-18 ENCOUNTER — Encounter
Admission: RE | Disposition: A | Discharge status: Home / Self Care | Payer: Self-pay | Source: Home / Self Care | Attending: Obstetrics and Gynecology

## 2018-12-18 ENCOUNTER — Other Ambulatory Visit: Payer: Self-pay

## 2018-12-18 DIAGNOSIS — F17210 Nicotine dependence, cigarettes, uncomplicated: Secondary | ICD-10-CM | POA: Insufficient documentation

## 2018-12-18 DIAGNOSIS — Z302 Encounter for sterilization: Secondary | ICD-10-CM | POA: Diagnosis not present

## 2018-12-18 DIAGNOSIS — Z3009 Encounter for other general counseling and advice on contraception: Secondary | ICD-10-CM | POA: Diagnosis present

## 2018-12-18 HISTORY — PX: LAPAROSCOPIC TUBAL LIGATION: SHX1937

## 2018-12-18 LAB — POCT PREGNANCY, URINE: Preg Test, Ur: NEGATIVE

## 2018-12-18 LAB — URINE DRUG SCREEN, QUALITATIVE (ARMC ONLY)
Amphetamines, Ur Screen: NOT DETECTED
Barbiturates, Ur Screen: NOT DETECTED
Benzodiazepine, Ur Scrn: NOT DETECTED
Cannabinoid 50 Ng, Ur ~~LOC~~: NOT DETECTED
Cocaine Metabolite,Ur ~~LOC~~: NOT DETECTED
MDMA (Ecstasy)Ur Screen: NOT DETECTED
Methadone Scn, Ur: NOT DETECTED
Opiate, Ur Screen: NOT DETECTED
Phencyclidine (PCP) Ur S: NOT DETECTED
Tricyclic, Ur Screen: NOT DETECTED

## 2018-12-18 SURGERY — LIGATION, FALLOPIAN TUBE, LAPAROSCOPIC
Anesthesia: General | Laterality: Bilateral

## 2018-12-18 MED ORDER — DEXMEDETOMIDINE HCL IN NACL 200 MCG/50ML IV SOLN
INTRAVENOUS | Status: DC | PRN
  Administered 2018-12-18: 8 ug via INTRAVENOUS

## 2018-12-18 MED ORDER — ROCURONIUM BROMIDE 50 MG/5ML IV SOLN
INTRAVENOUS | Status: AC
  Filled 2018-12-18: qty 1

## 2018-12-18 MED ORDER — FENTANYL CITRATE (PF) 100 MCG/2ML IJ SOLN
INTRAMUSCULAR | Status: AC
  Filled 2018-12-18: qty 2

## 2018-12-18 MED ORDER — IBUPROFEN 600 MG PO TABS: 600.0000 mg | ORAL_TABLET | Freq: Four times a day (QID) | ORAL | 0 refills | Status: DC | PRN

## 2018-12-18 MED ORDER — LIDOCAINE HCL (PF) 2 % IJ SOLN
INTRAMUSCULAR | Status: AC
  Filled 2018-12-18: qty 10

## 2018-12-18 MED ORDER — HYDROCODONE-ACETAMINOPHEN 7.5-325 MG PO TABS
1.0000 | ORAL_TABLET | Freq: Once | ORAL | Status: AC | PRN
  Administered 2018-12-18: 1 via ORAL
  Filled 2018-12-18: qty 1

## 2018-12-18 MED ORDER — DEXAMETHASONE SODIUM PHOSPHATE 10 MG/ML IJ SOLN
INTRAMUSCULAR | Status: AC
  Filled 2018-12-18: qty 1

## 2018-12-18 MED ORDER — FAMOTIDINE 20 MG PO TABS
20.0000 mg | ORAL_TABLET | Freq: Once | ORAL | Status: AC
  Administered 2018-12-18: 20 mg via ORAL

## 2018-12-18 MED ORDER — LACTATED RINGERS IV SOLN
INTRAVENOUS | Status: DC
  Administered 2018-12-18: 10:00:00 via INTRAVENOUS

## 2018-12-18 MED ORDER — ACETAMINOPHEN 160 MG/5ML PO SOLN
325.0000 mg | ORAL | Status: DC | PRN
  Filled 2018-12-18: qty 20.3

## 2018-12-18 MED ORDER — MIDAZOLAM HCL 2 MG/2ML IJ SOLN
INTRAMUSCULAR | Status: AC
  Filled 2018-12-18: qty 2

## 2018-12-18 MED ORDER — FAMOTIDINE 20 MG PO TABS
ORAL_TABLET | ORAL | Status: AC
  Filled 2018-12-18: qty 1

## 2018-12-18 MED ORDER — LACTATED RINGERS IV SOLN: INTRAVENOUS | Status: DC

## 2018-12-18 MED ORDER — MEPERIDINE HCL 50 MG/ML IJ SOLN: 6.2500 mg | INTRAMUSCULAR | Status: DC | PRN

## 2018-12-18 MED ORDER — HYDROCODONE-ACETAMINOPHEN 7.5-325 MG PO TABS
ORAL_TABLET | ORAL | Status: AC
  Filled 2018-12-18: qty 1

## 2018-12-18 MED ORDER — SUGAMMADEX SODIUM 200 MG/2ML IV SOLN
INTRAVENOUS | Status: DC | PRN
  Administered 2018-12-18: 200 mg via INTRAVENOUS

## 2018-12-18 MED ORDER — PROPOFOL 10 MG/ML IV BOLUS
INTRAVENOUS | Status: DC | PRN
  Administered 2018-12-18: 200 mg via INTRAVENOUS

## 2018-12-18 MED ORDER — BUPIVACAINE HCL 0.5 % IJ SOLN
INTRAMUSCULAR | Status: DC | PRN
  Administered 2018-12-18: 7 mL

## 2018-12-18 MED ORDER — HYDROCODONE-ACETAMINOPHEN 5-325 MG PO TABS: 1.0000 | ORAL_TABLET | Freq: Three times a day (TID) | ORAL | 0 refills | Status: DC | PRN

## 2018-12-18 MED ORDER — MIDAZOLAM HCL 2 MG/2ML IJ SOLN
INTRAMUSCULAR | Status: DC | PRN
  Administered 2018-12-18: 2 mg via INTRAVENOUS

## 2018-12-18 MED ORDER — SUGAMMADEX SODIUM 200 MG/2ML IV SOLN
INTRAVENOUS | Status: AC
  Filled 2018-12-18: qty 2

## 2018-12-18 MED ORDER — FENTANYL CITRATE (PF) 100 MCG/2ML IJ SOLN
INTRAMUSCULAR | Status: DC | PRN
  Administered 2018-12-18 (×4): 50 ug via INTRAVENOUS

## 2018-12-18 MED ORDER — PROMETHAZINE HCL 25 MG/ML IJ SOLN: 6.2500 mg | INTRAMUSCULAR | Status: DC | PRN

## 2018-12-18 MED ORDER — BUPIVACAINE HCL (PF) 0.5 % IJ SOLN
INTRAMUSCULAR | Status: AC
  Filled 2018-12-18: qty 30

## 2018-12-18 MED ORDER — KETOROLAC TROMETHAMINE 30 MG/ML IJ SOLN
30.0000 mg | Freq: Once | INTRAMUSCULAR | Status: AC | PRN
  Administered 2018-12-18: 30 mg via INTRAVENOUS

## 2018-12-18 MED ORDER — ROCURONIUM BROMIDE 100 MG/10ML IV SOLN
INTRAVENOUS | Status: DC | PRN
  Administered 2018-12-18: 25 mg via INTRAVENOUS

## 2018-12-18 MED ORDER — SUCCINYLCHOLINE CHLORIDE 20 MG/ML IJ SOLN
INTRAMUSCULAR | Status: DC | PRN
  Administered 2018-12-18: 140 mg via INTRAVENOUS

## 2018-12-18 MED ORDER — KETOROLAC TROMETHAMINE 30 MG/ML IJ SOLN
INTRAMUSCULAR | Status: AC
  Filled 2018-12-18: qty 1

## 2018-12-18 MED ORDER — ONDANSETRON HCL 4 MG/2ML IJ SOLN
INTRAMUSCULAR | Status: AC
  Filled 2018-12-18: qty 2

## 2018-12-18 MED ORDER — LIDOCAINE HCL (CARDIAC) PF 100 MG/5ML IV SOSY
PREFILLED_SYRINGE | INTRAVENOUS | Status: DC | PRN
  Administered 2018-12-18: 100 mg via INTRAVENOUS

## 2018-12-18 MED ORDER — FENTANYL CITRATE (PF) 100 MCG/2ML IJ SOLN: 25.0000 ug | INTRAMUSCULAR | Status: DC | PRN

## 2018-12-18 MED ORDER — ONDANSETRON HCL 4 MG/2ML IJ SOLN
INTRAMUSCULAR | Status: DC | PRN
  Administered 2018-12-18: 4 mg via INTRAVENOUS

## 2018-12-18 MED ORDER — ACETAMINOPHEN 325 MG PO TABS: 325.0000 mg | ORAL_TABLET | ORAL | Status: DC | PRN

## 2018-12-18 MED ORDER — EPHEDRINE SULFATE 50 MG/ML IJ SOLN
INTRAMUSCULAR | Status: DC | PRN
  Administered 2018-12-18: 5 mg via INTRAVENOUS

## 2018-12-18 MED ORDER — PROPOFOL 10 MG/ML IV BOLUS
INTRAVENOUS | Status: AC
  Filled 2018-12-18: qty 40

## 2018-12-18 MED ORDER — DEXAMETHASONE SODIUM PHOSPHATE 10 MG/ML IJ SOLN
INTRAMUSCULAR | Status: DC | PRN
  Administered 2018-12-18: 10 mg via INTRAVENOUS

## 2018-12-18 SURGICAL SUPPLY — 35 items
BAG URINE DRAINAGE (UROLOGICAL SUPPLIES) ×2 IMPLANT
BLADE SURG SZ11 CARB STEEL (BLADE) ×2 IMPLANT
CATH FOLEY 2WAY  5CC 16FR (CATHETERS) ×1
CATH URTH 16FR FL 2W BLN LF (CATHETERS) ×1 IMPLANT
CHLORAPREP W/TINT 26 (MISCELLANEOUS) ×2 IMPLANT
CLIP FILSHIE TUBAL LIGA STRL (Clip) ×2 IMPLANT
COVER WAND RF STERILE (DRAPES) ×2 IMPLANT
DERMABOND ADVANCED (GAUZE/BANDAGES/DRESSINGS) ×1
DERMABOND ADVANCED .7 DNX12 (GAUZE/BANDAGES/DRESSINGS) ×1 IMPLANT
DRAPE 3/4 80X56 (DRAPES) ×2 IMPLANT
DRAPE LEGGINS SURG 28X43 STRL (DRAPES) ×2 IMPLANT
DRAPE UNDER BUTTOCK W/FLU (DRAPES) ×2 IMPLANT
GLOVE BIO SURGEON STRL SZ7 (GLOVE) ×4 IMPLANT
GLOVE BIOGEL PI IND STRL 7.5 (GLOVE) ×1 IMPLANT
GLOVE BIOGEL PI INDICATOR 7.5 (GLOVE) ×1
GOWN STRL REUS W/ TWL LRG LVL3 (GOWN DISPOSABLE) ×2 IMPLANT
GOWN STRL REUS W/TWL LRG LVL3 (GOWN DISPOSABLE) ×2
KIT PINK PAD W/HEAD ARE REST (MISCELLANEOUS) ×2
KIT PINK PAD W/HEAD ARM REST (MISCELLANEOUS) ×1 IMPLANT
KIT TURNOVER CYSTO (KITS) ×2 IMPLANT
LABEL OR SOLS (LABEL) ×2 IMPLANT
NEEDLE HYPO 22GX1.5 SAFETY (NEEDLE) ×2 IMPLANT
NS IRRIG 500ML POUR BTL (IV SOLUTION) ×2 IMPLANT
PACK LAP CHOLECYSTECTOMY (MISCELLANEOUS) ×2 IMPLANT
PAD OB MATERNITY 4.3X12.25 (PERSONAL CARE ITEMS) ×2 IMPLANT
PAD PREP 24X41 OB/GYN DISP (PERSONAL CARE ITEMS) ×2 IMPLANT
SET TUBE SMOKE EVAC HIGH FLOW (TUBING) ×2 IMPLANT
SOL PREP PROV IODINE SCRUB 4OZ (MISCELLANEOUS) ×2 IMPLANT
SURGILUBE 2OZ TUBE FLIPTOP (MISCELLANEOUS) ×2 IMPLANT
SUT MNCRL 3-0 UNDYED SH (SUTURE) IMPLANT
SUT MONOCRYL 3-0 UNDYED (SUTURE)
SUT VIC AB 2-0 UR6 27 (SUTURE) ×2 IMPLANT
SUT VIC AB 4-0 FS2 27 (SUTURE) ×2 IMPLANT
TROCAR ENDO BLADELESS 11MM (ENDOMECHANICALS) ×2 IMPLANT
TROCAR XCEL NON-BLD 5MMX100MML (ENDOMECHANICALS) ×2 IMPLANT

## 2018-12-18 NOTE — Op Note (Signed)
  Operative Note    Pre-Op Diagnosis: multiparity, desires permanent sterilization  Post-Op Diagnosis: multiparity, desires permanent sterilization  Procedures: Laparoscopic bilateral tubal ligation using Filshie clips  Primary Surgeon: Prentice Docker, MD   Assistant Surgeon: Humberto Leep, MD; high level assistant utilized due to lack of another Counselling psychologist  EBL: minimal   IVF: 700 mL   Urine output: 300 mL  Specimens: none  Drains: none  Complications: None   Disposition: PACU   Condition: Stable   Findings: normal appearing uterus and bilateral fallopian tubes and ovaries.   Procedure Summary:   After adequate general anesthesia was obtained, the patient was placed in dorsal lithotomy positioning and prepped & draped in the usual, sterile fashion.  Veress needle entry performed via 5 mm vertical, infraumbilical incision.  Hanging drop test positive.  Abdomen insufflated with CO2 gas without incident to approximately 3 liters.  Direct entry with Optiview achieved.  Patient placed in Trendelenburg positioning.  8 mm suprapubic incision made and accessory port advanced under direct visualization.    Filshie applicator was advanced through the port and right fallopian tube elevated.  Filshie clip applied at the proximal tube with care to occlude the entire diameter of the tube.  Filshie clip applied without incident.  In a similar fashion, the contralateral tube was identified, grasped with Filshie applicator and clip was applied to the proximal tube without incident.    Bilateral sites were visualized and complete occlusion confirmed.  All instruments were removed, abdomen deflated, and skin closed with skin glue.   The patient tolerated the procedure well.  Sponge, lap, needle, and instrument counts were correct x 2.  VTE prophylaxis: SCDs. Antibiotic prophylaxis: none indicated nor given. She was awakened in the operating room and was taken to the PACU in stable  condition.   Prentice Docker, MD 12/18/2018 12:09 PM

## 2018-12-18 NOTE — Anesthesia Preprocedure Evaluation (Addendum)
Anesthesia Evaluation  Patient identified by MRN, date of birth, ID band Patient awake  General Assessment Comment:Pt denies any family members having issues w anesthesia  Reviewed: Allergy & Precautions, H&P , NPO status , reviewed documented beta blocker date and time   History of Anesthesia Complications (+) Family history of anesthesia reaction  Airway Mallampati: II  TM Distance: >3 FB Neck ROM: full    Dental  (+) Chipped   Pulmonary Current Smoker and Patient abstained from smoking.,    Pulmonary exam normal        Cardiovascular Normal cardiovascular exam     Neuro/Psych  Headaches, PSYCHIATRIC DISORDERS Anxiety Depression    GI/Hepatic neg GERD  ,GERD only when pregnant   Endo/Other    Renal/GU      Musculoskeletal   Abdominal   Peds  Hematology  (+) Blood dyscrasia, anemia ,   Anesthesia Other Findings Past Medical History: No date: Anemia No date: Anxiety No date: Depression No date: Family history of adverse reaction to anesthesia No date: Migraine Past Surgical History: 07/17/2016: BREAST BIOPSY; Left     Comment:  fibroadenoma BMI    Body Mass Index: 24.96 kg/m     Reproductive/Obstetrics                            Anesthesia Physical Anesthesia Plan  ASA: II  Anesthesia Plan: General   Post-op Pain Management:    Induction: Intravenous  PONV Risk Score and Plan: 4 or greater and Ondansetron, Dexamethasone, Midazolam, Metaclopromide and Treatment may vary due to age or medical condition  Airway Management Planned: Oral ETT  Additional Equipment:   Intra-op Plan:   Post-operative Plan: Extubation in OR  Informed Consent: I have reviewed the patients History and Physical, chart, labs and discussed the procedure including the risks, benefits and alternatives for the proposed anesthesia with the patient or authorized representative who has indicated his/her  understanding and acceptance.     Dental Advisory Given  Plan Discussed with: CRNA  Anesthesia Plan Comments:         Anesthesia Quick Evaluation

## 2018-12-18 NOTE — Anesthesia Post-op Follow-up Note (Signed)
Anesthesia QCDR form completed.        

## 2018-12-18 NOTE — Interval H&P Note (Signed)
History and Physical Interval Note:  12/18/2018 10:24 AM  Toni Blake  has presented today for surgery, with the diagnosis of DESIRES PERMANENT STERILITY.  The various methods of treatment have been discussed with the patient and family. After consideration of risks, benefits and other options for treatment, the patient has consented to  Procedure(s): LAPAROSCOPIC TUBAL LIGATION BILATERAL (Bilateral) as a surgical intervention.  The patient's history has been reviewed, patient examined, no change in status, stable for surgery.  I have reviewed the patient's chart and labs.  Questions were answered to the patient's satisfaction.  Failure rate of 3-5 in every 1,000 reviewed.  Consents reviewed and signed.  Patient wishes to proceed.  Prentice Docker, MD, [facility], [facility] 12/18/2018 10:24 AM

## 2018-12-18 NOTE — Transfer of Care (Signed)
Immediate Anesthesia Transfer of Care Note  Patient: Toni Blake  Procedure(s) Performed: LAPAROSCOPIC TUBAL LIGATION BILATERAL (Bilateral )  Patient Location: PACU  Anesthesia Type:General  Level of Consciousness: awake  Airway & Oxygen Therapy: Patient Spontanous Breathing and Patient connected to face mask oxygen  Post-op Assessment: Report given to RN and Post -op Vital signs reviewed and stable  Post vital signs: stable  Last Vitals:  Vitals Value Taken Time  BP    Temp    Pulse    Resp    SpO2      Last Pain:  Vitals:   12/18/18 1015  PainSc: 8          Complications: No apparent anesthesia complications

## 2018-12-18 NOTE — Anesthesia Procedure Notes (Signed)
Procedure Name: Intubation Date/Time: 12/18/2018 11:27 AM Performed by: Lavone Orn, CRNA Pre-anesthesia Checklist: Patient identified, Emergency Drugs available, Suction available, Patient being monitored and Timeout performed Patient Re-evaluated:Patient Re-evaluated prior to induction Oxygen Delivery Method: Circle system utilized Preoxygenation: Pre-oxygenation with 100% oxygen Induction Type: IV induction Ventilation: Mask ventilation without difficulty Laryngoscope Size: Mac and 3 Grade View: Grade I Tube type: Oral Tube size: 7.0 mm Number of attempts: 1 Airway Equipment and Method: Stylet Placement Confirmation: ETT inserted through vocal cords under direct vision,  positive ETCO2 and breath sounds checked- equal and bilateral Secured at: 21 cm Tube secured with: Tape Dental Injury: Teeth and Oropharynx as per pre-operative assessment

## 2018-12-21 ENCOUNTER — Emergency Department
Admission: EM | Admit: 2018-12-21 | Discharge: 2018-12-21 | Disposition: A | Discharge status: Home / Self Care | Payer: Medicaid Other | Attending: Emergency Medicine | Admitting: Emergency Medicine

## 2018-12-21 ENCOUNTER — Other Ambulatory Visit: Payer: Self-pay

## 2018-12-21 DIAGNOSIS — R21 Rash and other nonspecific skin eruption: Secondary | ICD-10-CM | POA: Diagnosis present

## 2018-12-21 DIAGNOSIS — F17210 Nicotine dependence, cigarettes, uncomplicated: Secondary | ICD-10-CM | POA: Insufficient documentation

## 2018-12-21 MED ORDER — TRAMADOL HCL 50 MG PO TABS: 50.0000 mg | ORAL_TABLET | Freq: Four times a day (QID) | ORAL | 0 refills | Status: AC | PRN

## 2018-12-21 MED ORDER — FAMOTIDINE 20 MG PO TABS: 20.0000 mg | ORAL_TABLET | Freq: Two times a day (BID) | ORAL | 1 refills | Status: DC

## 2018-12-21 MED ORDER — DIPHENHYDRAMINE HCL 25 MG PO TABS: 25.0000 mg | ORAL_TABLET | Freq: Three times a day (TID) | ORAL | 0 refills | Status: DC | PRN

## 2018-12-21 NOTE — ED Triage Notes (Signed)
Patient presents with rash over abdomen, arms, legs and face. Patient had her tubes tied on Thursday with Dr. Glennon Mac. Yesterday she noticed a rash that started on her abdomen and progressed to both arms, lower legs and now the face. Rash is a fine red raised rash that she states is now starting to become itchy. Patient without difficulty breathing.

## 2018-12-21 NOTE — ED Provider Notes (Signed)
[facility] Emergency Department Provider Note  ____________________________________________  Time seen: Approximately 11:39 PM  I have reviewed the triage vital signs and the nursing notes.   HISTORY  Chief Complaint Rash    HPI Toni Blake is a 28 y.o. female presents to the emergency department with a dry, papular, sandpaper like rash of the abdomen.  Patient states that rash is mildly pruritic.  Patient had a tubal ligation procedure 3 days ago and has been taking Norco for pain which she does not typically take.  Patient stopped Norco 2 days ago and still has a rash.  She denies headache, pharyngitis, rhinorrhea, nasal congestion or nonproductive cough.  She has never experienced a rash like this in the past.  She denies known contact exposures to linens or soaps. No known tick bites.  Patient denies nausea, vomiting or abdominal pain.  No diarrhea.  No other alleviating measures have been attempted.        Past Medical History:  Diagnosis Date  . Anemia   . Anxiety   . Depression   . Family history of adverse reaction to anesthesia   . Migraine     Patient Active Problem List   Diagnosis Date Noted  . Consultation for sterilization 12/18/2018  . Postpartum care following vaginal delivery 08/30/2018  . Indication for care in labor and delivery, antepartum 07/21/2018  . Panic disorder 01/07/2016  . Tobacco use disorder 01/07/2016  . Adjustment disorder with depressed mood 01/06/2016  . Suicidal ideation 01/06/2016  . Migraine 01/06/2016  . Cocaine abuse ([place]) 01/06/2016    Past Surgical History:  Procedure Laterality Date  . 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 LAPAROSCOPIC TUBAL LIGATION Bilateral 12/18/2018   Procedure: LAPAROSCOPIC TUBAL LIGATION BILATERAL;  Surgeon: Will Bonnet, MD;  Location: [facility] ORS;  Service: Gynecology;  Laterality: Bilateral;    Prior to Admission medications   Medication Sig Start Date  End Date Taking? Authorizing Provider  diphenhydrAMINE (BENADRYL ALLERGY) 25 MG tablet Take 1 tablet (25 mg total) by mouth every 8 (eight) hours as needed for up to 5 days. 12/21/18 12/26/18  Lannie Fields, PA-C  famotidine (PEPCID) 20 MG tablet Take 1 tablet (20 mg total) by mouth 2 (two) times daily for 5 days. 12/21/18 12/26/18  Lannie Fields, PA-C  HYDROcodone-acetaminophen (NORCO) 5-325 MG tablet Take 1 tablet by mouth every 8 (eight) hours as needed for moderate pain. 12/18/18   Will Bonnet, MD  ibuprofen (ADVIL) 600 MG tablet Take 1 tablet (600 mg total) by mouth every 6 (six) hours as needed for mild pain or cramping. 12/18/18   Will Bonnet, MD  traMADol (ULTRAM) 50 MG tablet Take 1 tablet (50 mg total) by mouth every 6 (six) hours as needed for up to 3 days. 12/21/18 12/24/18  Lannie Fields, PA-C    Allergies Patient has no known allergies.  Family History  Problem Relation Age of Onset  . Depression Mother   . Migraines Mother   . Asthma Father   . Depression Sister   . Diabetes Paternal Grandmother     Social History Social History   Tobacco Use  . Smoking status: Current Every Day Smoker    Packs/day: 1.00    Types: Cigarettes  . Smokeless tobacco: Never Used  Substance Use Topics  . Alcohol use: No  . Drug use: No    Comment: Past use of MJ and cocaine     Review of  Systems  Constitutional: No fever/chills Eyes: No visual changes. No discharge ENT: No upper respiratory complaints. Cardiovascular: no chest pain. Respiratory: no cough. No SOB. Gastrointestinal: No abdominal pain.  No nausea, no vomiting.  No diarrhea.  No constipation. Musculoskeletal: Negative for musculoskeletal pain. Skin: Patient has rash. Neurological: Negative for headaches, focal weakness or numbness.  ____________________________________________   PHYSICAL EXAM:  VITAL SIGNS: ED Triage Vitals  Enc Vitals Group     BP 12/21/18 2108 128/79     Pulse Rate 12/21/18 2108 93      Resp 12/21/18 2108 18     Temp 12/21/18 2108 98.8 F (37.1 C)     Temp Source 12/21/18 2108 Oral     SpO2 12/21/18 2108 99 %     Weight 12/21/18 2109 152 lb (68.9 kg)     Height 12/21/18 2109 5\' 5"  (1.651 m)     Head Circumference --      Peak Flow --      Pain Score 12/21/18 2109 10     Pain Loc --      Pain Edu? --      Excl. in [place]? --      Constitutional: Alert and oriented. Well appearing and in no acute distress. Eyes: Conjunctivae are normal. PERRL. EOMI. Head: Atraumatic. ENT: Cardiovascular: Normal rate, regular rhythm. Normal S1 and S2.  Good peripheral circulation. Respiratory: Normal respiratory effort without tachypnea or retractions. Lungs CTAB. Good air entry to the bases with no decreased or absent breath sounds. Musculoskeletal: Full range of motion to all extremities. No gross deformities appreciated. Neurologic:  Normal speech and language. No gross focal neurologic deficits are appreciated.  Skin: Patient has dry, maculopapular rash of abdomen.  Rash is mildly erythematous and sandpaperlike. Psychiatric: Mood and affect are normal. Speech and behavior are normal. Patient exhibits appropriate insight and judgement.   ____________________________________________   LABS (all labs ordered are listed, but only abnormal results are displayed)  Labs Reviewed  GROUP A STREP BY PCR   ____________________________________________  EKG   ____________________________________________  RADIOLOGY  No results found.  ____________________________________________    PROCEDURES  Procedure(s) performed:    Procedures    Medications - No data to display   ____________________________________________   INITIAL IMPRESSION / ASSESSMENT AND PLAN / ED COURSE  Pertinent labs & imaging results that were available during my care of the patient were reviewed by me and considered in my medical decision making (see chart for details).  Review of the [place]  CSRS was performed in accordance of the [place] prior to dispensing any controlled drugs.         Assessment and plan Rash 28 year old female presents to the emergency department with a rash that is been present for the past 2 days.  Vital signs were reassuring at triage.  Patient had a dry, sandpaperlike, maculopapular rash of abdomen.  Posterior pharynx was nonerythematous.  Patient denies fever, pharyngitis or nausea.   Differential diagnosis includes scarlatina, drug allergy and viral exanthem.  Patient was advised to discontinue Norco and was transitioned to tramadol during this emergency department encounter.  She was swabbed for group A strep.  Patient was given a prescription for amoxicillin and was advised only to fill amoxicillin if she test positive.  Patient opted to be discharged prior to group A strep results returning.  Told patient that I will call her with group A strep results.  Patient was discharged with both Benadryl and famotidine.  Advised taking Benadryl 3 times  daily over the next 5 days and famotidine twice daily over the next 5 days.  Return precautions were given.  All patient questions were answered.   ____________________________________________  FINAL CLINICAL IMPRESSION(S) / ED DIAGNOSES  Final diagnoses:  Rash      NEW MEDICATIONS STARTED DURING THIS VISIT:  ED Discharge Orders         Ordered    traMADol (ULTRAM) 50 MG tablet  Every 6 hours PRN     12/21/18 2331    diphenhydrAMINE (BENADRYL ALLERGY) 25 MG tablet  Every 8 hours PRN     12/21/18 2331    famotidine (PEPCID) 20 MG tablet  2 times daily     12/21/18 2331              This chart was dictated using voice recognition software/Dragon. Despite best efforts to proofread, errors can occur which can change the meaning. Any change was purely unintentional.    Lannie Fields, PA-C 12/21/18 2351    Harvest Dark, MD 12/24/18 [phone]

## 2018-12-22 LAB — GROUP A STREP BY PCR: Group A Strep by PCR: NOT DETECTED

## 2018-12-24 NOTE — Anesthesia Postprocedure Evaluation (Signed)
Anesthesia Post Note  Patient: Toni Blake  Procedure(s) Performed: LAPAROSCOPIC TUBAL LIGATION BILATERAL (Bilateral )  Patient location during evaluation: PACU Anesthesia Type: General Level of consciousness: awake and alert Pain management: pain level controlled Vital Signs Assessment: post-procedure vital signs reviewed and stable Respiratory status: spontaneous breathing, nonlabored ventilation and respiratory function stable Cardiovascular status: blood pressure returned to baseline and stable Postop Assessment: no apparent nausea or vomiting Anesthetic complications: no     Last Vitals:  Vitals:   12/18/18 1320 12/18/18 1334  BP: 112/79 129/67  Pulse: (!) 54   Resp: 13 18  Temp:  (!) 36.2 C  SpO2: 100% 100%    Last Pain:  Vitals:   12/18/18 1334  PainSc: 0-No pain                 Alphonsus Sias

## 2018-12-26 ENCOUNTER — Ambulatory Visit (INDEPENDENT_AMBULATORY_CARE_PROVIDER_SITE_OTHER): Payer: Medicaid Other | Admitting: Obstetrics and Gynecology

## 2018-12-26 ENCOUNTER — Other Ambulatory Visit: Payer: Self-pay

## 2018-12-26 ENCOUNTER — Ambulatory Visit: Payer: Medicaid Other | Admitting: Obstetrics and Gynecology

## 2018-12-26 ENCOUNTER — Encounter: Payer: Self-pay | Admitting: Obstetrics and Gynecology

## 2018-12-26 VITALS — BP 122/74 | Wt 146.0 lb

## 2018-12-26 DIAGNOSIS — Z09 Encounter for follow-up examination after completed treatment for conditions other than malignant neoplasm: Secondary | ICD-10-CM

## 2018-12-26 NOTE — Progress Notes (Signed)
   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  Subjective: Eating a regular diet without difficulty. The patient is not having any pain.  Activity: increasing slowly, but has no overt limitations apart from lifting.  Denies fevers, chills, nausea, vomiting. Is voiding and having normal bowel movements.  Objective: Vitals:   12/26/18 1543  BP: 122/74   Vital Signs: BP 122/74   Wt 146 lb (66.2 kg)   LMP 12/01/2018 (Exact Date)   BMI 24.30 kg/m  Constitutional: Well nourished, well developed female in no acute distress.  HEENT: normal Skin: Warm and dry.  Extremity: no edema  Abdomen: Soft, non-tender, normal bowel sounds; no bruits, organomegaly or masses. clean, dry and intact  Pelvic exam: deferred     Assessment: 28 y.o. s/p laparoscopic BTL progressing well  Plan: Patient has done well after surgery with no apparent complications.  I have discussed the post-operative course to date, and the expected progress moving forward.  The patient understands what complications to be concerned about.  I will see the patient in routine follow up, or sooner if needed.    Activity plan: lifting restriction of no > 25 lbs for next 3-4 weeks.  Note provided for work.  Prentice Docker, MD 12/26/2018, 4:43 PM

## 2019-07-14 ENCOUNTER — Observation Stay
Admission: EM | Admit: 2019-07-14 | Discharge: 2019-07-14 | Disposition: A | Discharge status: Home / Self Care | Payer: Medicaid Other | Attending: Certified Nurse Midwife | Admitting: Certified Nurse Midwife

## 2019-07-14 ENCOUNTER — Other Ambulatory Visit: Payer: Self-pay

## 2019-07-14 DIAGNOSIS — Z820 Family history of epilepsy and other diseases of the nervous system: Secondary | ICD-10-CM | POA: Diagnosis not present

## 2019-07-14 DIAGNOSIS — O99333 Smoking (tobacco) complicating pregnancy, third trimester: Secondary | ICD-10-CM | POA: Diagnosis not present

## 2019-07-14 DIAGNOSIS — G43909 Migraine, unspecified, not intractable, without status migrainosus: Secondary | ICD-10-CM | POA: Diagnosis not present

## 2019-07-14 DIAGNOSIS — F419 Anxiety disorder, unspecified: Secondary | ICD-10-CM | POA: Insufficient documentation

## 2019-07-14 DIAGNOSIS — Z833 Family history of diabetes mellitus: Secondary | ICD-10-CM | POA: Insufficient documentation

## 2019-07-14 DIAGNOSIS — F329 Major depressive disorder, single episode, unspecified: Secondary | ICD-10-CM | POA: Insufficient documentation

## 2019-07-14 DIAGNOSIS — Z3A29 29 weeks gestation of pregnancy: Secondary | ICD-10-CM | POA: Diagnosis not present

## 2019-07-14 DIAGNOSIS — O4693 Antepartum hemorrhage, unspecified, third trimester: Secondary | ICD-10-CM | POA: Diagnosis not present

## 2019-07-14 DIAGNOSIS — Z825 Family history of asthma and other chronic lower respiratory diseases: Secondary | ICD-10-CM | POA: Diagnosis not present

## 2019-07-14 DIAGNOSIS — Z791 Long term (current) use of non-steroidal anti-inflammatories (NSAID): Secondary | ICD-10-CM | POA: Insufficient documentation

## 2019-07-14 LAB — CBC
HCT: 30 % — ABNORMAL LOW (ref 36.0–46.0)
Hemoglobin: 10 g/dL — ABNORMAL LOW (ref 12.0–15.0)
MCH: 29.3 pg (ref 26.0–34.0)
MCHC: 33.3 g/dL (ref 30.0–36.0)
MCV: 88 fL (ref 80.0–100.0)
Platelets: 194 10*3/uL (ref 150–400)
RBC: 3.41 MIL/uL — ABNORMAL LOW (ref 3.87–5.11)
RDW: 12.7 % (ref 11.5–15.5)
WBC: 12.7 10*3/uL — ABNORMAL HIGH (ref 4.0–10.5)
nRBC: 0 % (ref 0.0–0.2)

## 2019-07-14 MED ORDER — BETAMETHASONE SOD PHOS & ACET 6 (3-3) MG/ML IJ SUSP
12.0000 mg | INTRAMUSCULAR | Status: DC
  Administered 2019-07-14: 16:00:00 12 mg via INTRAMUSCULAR
  Filled 2019-07-14: qty 5

## 2019-07-14 NOTE — Discharge Summary (Signed)
Toni Blake is a 29 y.o. female. She is at [redacted]w[redacted]d gestation. Patient's last menstrual period was 12/01/2018 (exact date). Estimated Date of Delivery: 09/26/19  Prenatal care site: [facility]  Chief complaint: vaginal bleeding Location: vagina Onset/timing: last night around 2200  Duration: intermittent Quality: vaginal bleeding Severity: mild to moderate Aggravating or alleviating conditions: standing on her feet for long periods Associated signs/symptoms: none Context: Toni Blake was seen in the office on 07/10/2019 for repeat ultrasound, where it was noted that she has an anterior placenta previa. She reported on that day that she had had some vaginal bleeding the prior day. It had stopped. She is here today with reports of vaginal bleeding that started again last night around 2200 while she was at work. She went into work around 1700 and is mostly on her feet. 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 She felt a gush of blood while standing, went to the bathroom, and it took 3-4 wipes to clear the bleeding, which at that point was bright red. She wore a thin pad overnight, changing it in the morning, at which point it was about 50% saturated. She changed her pad again around 0900, at which point it was 25% saturated. She has been wearing the same pad since then which has a smear of brown blood.   S: Resting comfortably.   She reports:  -active fetal movement -no leakage of fluid -no contractions  Maternal Medical History:   Past Medical History:  Diagnosis Date  . Anemia   . Anxiety   . Depression   . Family history of adverse reaction to anesthesia   . Migraine     Past Surgical History:  Procedure Laterality Date  . 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 LAPAROSCOPIC TUBAL LIGATION Bilateral 12/18/2018   Procedure: LAPAROSCOPIC TUBAL LIGATION BILATERAL;  Surgeon: Will Bonnet, MD;  Location: [facility]  ORS;  Service: Gynecology;  Laterality: Bilateral;    No Known Allergies  Prior to Admission medications   Medication Sig Start Date End Date Taking? Authorizing Provider  Prenatal Vit-Fe Fumarate-FA (MULTIVITAMIN-PRENATAL) 27-0.8 MG TABS tablet Take 1 tablet by mouth daily at 12 noon.   Yes [provider]  diphenhydrAMINE (BENADRYL ALLERGY) 25 MG tablet Take 1 tablet (25 mg total) by mouth every 8 (eight) hours as needed for up to 5 days. Patient not taking: Reported on 12/26/2018 12/21/18 12/26/18  Lannie Fields, PA-C  famotidine (PEPCID) 20 MG tablet Take 1 tablet (20 mg total) by mouth 2 (two) times daily for 5 days. Patient not taking: Reported on 12/26/2018 12/21/18 12/26/18  Lannie Fields, PA-C  HYDROcodone-acetaminophen [facility]) 5-325 MG tablet Take 1 tablet by mouth every 8 (eight) hours as needed for moderate pain. Patient not taking: Reported on 12/26/2018 12/18/18   Will Bonnet, MD  ibuprofen (ADVIL) 600 MG tablet Take 1 tablet (600 mg total) by mouth every 6 (six) hours as needed for mild pain or cramping. Patient not taking: Reported on 12/26/2018 12/18/18   Will Bonnet, MD  traMADol Veatrice Bourbon) 50 MG tablet Take by mouth every 6 (six) hours as needed.    [provider]     Social History: She  reports that she has been smoking cigarettes. She has been smoking about 1.00 pack per day. She has never used smokeless tobacco. She reports that she does not drink alcohol or use drugs.  Family History: family  history includes Asthma in her father; Depression in her mother and sister; Diabetes in her paternal grandmother; Migraines in her mother.   Review of Systems: A full review of systems was performed and negative except as noted in the HPI.     O:  BP 107/66 (BP Location: Right Arm)   Pulse 100   Temp 98 F (36.7 C)   Resp 18   LMP 12/01/2018 (Exact Date)  Results for orders placed or performed during the hospital encounter of 07/14/19 (from the past  48 hour(s))  CBC   Collection Time: 07/14/19  3:21 PM  Result Value Ref Range   WBC 12.7 (H) 4.0 - 10.5 K/uL   RBC 3.41 (L) 3.87 - 5.11 MIL/uL   Hemoglobin 10.0 (L) 12.0 - 15.0 g/dL   HCT 30.0 (L) 36.0 - 46.0 %   MCV 88.0 80.0 - 100.0 fL   MCH 29.3 26.0 - 34.0 pg   MCHC 33.3 30.0 - 36.0 g/dL   RDW 12.7 11.5 - 15.5 %   Platelets 194 150 - 400 K/uL   nRBC 0.0 0.0 - 0.2 %     Constitutional: NAD, AAOx3  HE/ENT: extraocular movements grossly intact, moist mucous membranes CV: RRR PULM: normal respiratory effort, CTABL     Abd: gravid, non-tender, non-distended, soft      Ext: Non-tender, Nonedmeatous   Psych: mood appropriate, speech normal Pelvic: SSE: cervix visually closed, no active bleeding, blood-tinged mucous present at the cervix, no pooling of blood  NST:  Baseline: 130bpm Variability: moderate Accelerations: 15x15 present x >2 Decelerations: absent Time: 37mins Toco: quiet   A/P: 29 y.o. [redacted]w[redacted]d here for antenatal surveillance during pregnancy.  Principle diagnosis: vaginal bleeding in pregnancy, placenta previa  Labor  Not present  Fetal Wellbeing  Reactive NST, reassuring for GA  Known placenta previa with vaginal bleeding  Rh positive  Last ultrasound 07/10/2019 with anterior previa  Bleeding minimal sine 0900 this morning, no active bleeding on speculum exam  CBC stable from last level drawn on 07/10/2019  07/10/2019: WBC 12.4 Hgb 9.6 Hct 28.7 Plt 202  07/14/2019: WBC 12.7 Hgb 10.0 Hct 30.0 Plt 194  Betamethasone given, with patient to return tomorrow for second dose.   Reviewed recommendation for pelvic rest and avoiding strenuous activity. Patient is scheduled to work again tomorrow, though a shorter shift. Discussed recommendation to rest and not work for a view days, though it is ultimately her choice if she goes to work or not. Letter written to keep her out of work for the next 3 days. Discussed that if she starts bleeding again after being on her  feet at work, we may recommend adjustments to her work schedule or recommend that she stop working.   D/c home stable, precautions reviewed, follow-up as scheduled.    Discussed with Dr. Ouida Sills, who recommends CBC today, administration of steroids for fetal lung maturity in the case of indicated preterm delivery, and keeping patient out of work for a few days. Patient unsure that she can not go to work, but will consider it. Letter written to keep her out of work until 07/17/2019. Return precautions reviewed in detail.   [name] 07/14/2019 3:45 PM  ----- [name], CNM Certified Nurse Midwife [facility], Department of [facility]

## 2019-07-14 NOTE — Progress Notes (Signed)
Pt G5P3 presents with vaginal bleeding since around 10pm last night. Pt states she has used 3 pads and compares bleeding to her period. Pt reports some dark and bright red blood. +FM. No CTX/cramping. Reports intermittent sharp pain on her left side. Pt reports she was seen by [facility] last Friday when she was also having vaginal bleeding and clots. No other concerns. VSS. Monitors applied. Will continue to monitor pt. CNM notified of pt.

## 2019-07-14 NOTE — Progress Notes (Signed)
Toni Blake is a 29 y.o. female. She is at [redacted]w[redacted]d gestation. Patient's last menstrual period was 12/01/2018 (exact date). Estimated Date of Delivery: 09/26/19  Prenatal care site: [facility]  Chief complaint: vaginal bleeding Location: vagina Onset/timing: last night around 2200  Duration: intermittent Quality: vaginal bleeding Severity: mild to moderate Aggravating or alleviating conditions: standing on her feet for long periods Associated signs/symptoms: none Context: Kaydince was seen in the office on 07/10/2019 for repeat ultrasound, where it was noted that she has an anterior placenta previa. She reported on that day that she had had some vaginal bleeding the prior day. It had stopped. She is here today with reports of vaginal bleeding that started again last night around 2200 while she was at work. She went into work around 1700 and is mostly on her feet. 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 She felt a gush of blood while standing, went to the bathroom, and it took 3-4 wipes to clear the bleeding, which at that point was bright red. She wore a thin pad overnight, changing it in the morning, at which point it was about 50% saturated. She changed her pad again around 0900, at which point it was 25% saturated. She has been wearing the same pad since then which has a smear of brown blood.   S: Resting comfortably.   She reports:  -active fetal movement -no leakage of fluid -no contractions  Maternal Medical History:   Past Medical History:  Diagnosis Date  . Anemia   . Anxiety   . Depression   . Family history of adverse reaction to anesthesia   . Migraine     Past Surgical History:  Procedure Laterality Date  . 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 LAPAROSCOPIC TUBAL LIGATION Bilateral 12/18/2018   Procedure: LAPAROSCOPIC TUBAL LIGATION BILATERAL;  Surgeon: Will Bonnet, MD;  Location: [facility]  ORS;  Service: Gynecology;  Laterality: Bilateral;    No Known Allergies  Prior to Admission medications   Medication Sig Start Date End Date Taking? Authorizing Provider  Prenatal Vit-Fe Fumarate-FA (MULTIVITAMIN-PRENATAL) 27-0.8 MG TABS tablet Take 1 tablet by mouth daily at 12 noon.   Yes [provider]  diphenhydrAMINE (BENADRYL ALLERGY) 25 MG tablet Take 1 tablet (25 mg total) by mouth every 8 (eight) hours as needed for up to 5 days. Patient not taking: Reported on 12/26/2018 12/21/18 12/26/18  Lannie Fields, PA-C  famotidine (PEPCID) 20 MG tablet Take 1 tablet (20 mg total) by mouth 2 (two) times daily for 5 days. Patient not taking: Reported on 12/26/2018 12/21/18 12/26/18  Lannie Fields, PA-C  HYDROcodone-acetaminophen [facility]) 5-325 MG tablet Take 1 tablet by mouth every 8 (eight) hours as needed for moderate pain. Patient not taking: Reported on 12/26/2018 12/18/18   Will Bonnet, MD  ibuprofen (ADVIL) 600 MG tablet Take 1 tablet (600 mg total) by mouth every 6 (six) hours as needed for mild pain or cramping. Patient not taking: Reported on 12/26/2018 12/18/18   Will Bonnet, MD  traMADol Veatrice Bourbon) 50 MG tablet Take by mouth every 6 (six) hours as needed.    [provider]     Social History: She  reports that she has been smoking cigarettes. She has been smoking about 1.00 pack per day. She has never used smokeless tobacco. She reports that she does not drink alcohol or use drugs.  Family History: family  history includes Asthma in her father; Depression in her mother and sister; Diabetes in her paternal grandmother; Migraines in her mother.   Review of Systems: A full review of systems was performed and negative except as noted in the HPI.     O:  BP 107/66 (BP Location: Right Arm)   Pulse 100   Temp 98 F (36.7 C)   Resp 18   LMP 12/01/2018 (Exact Date)  No results found for this or any previous visit (from the past 48 hour(s)).   Constitutional:  NAD, AAOx3  HE/ENT: extraocular movements grossly intact, moist mucous membranes CV: RRR PULM: normal respiratory effort, CTABL     Abd: gravid, non-tender, non-distended, soft      Ext: Non-tender, Nonedmeatous   Psych: mood appropriate, speech normal Pelvic: SSE: cervix visually closed, no active bleeding, blood-tinged mucous present at the cervix, no pooling of blood  NST:  Baseline: 130bpm Variability: moderate Accelerations: 15x15 present x >2 Decelerations: absent Time: 36mins Toco: quiet   A/P: 29 y.o. [redacted]w[redacted]d here for antenatal surveillance during pregnancy.  Principle diagnosis: vaginal bleeding in pregnancy, placenta previa  Labor  Not present  Fetal Wellbeing  Reactive NST, reassuring for GA  Known placenta previa  Last ultrasound 07/10/2019 with anterior previa  Reviewed recommendation for pelvic rest and avoiding strenuous activity. Patient works again Architectural technologist, though a shorter shift, and is scheduled for another long shift in three days. Discussed that if she starts bleeding again after being on her feet at work, we may recommend adjustments to her work schedule.   Vaginal bleeding  Rh positive  Bleeding minimal sine 0900 this morning, no active bleeding on speculum exam  D/c home stable, precautions reviewed, follow-up as scheduled.    Toni Blake 07/14/2019 2:40 PM  ----- Toni Blake, CNM Certified Nurse Midwife [facility], Department of [facility]

## 2019-07-15 ENCOUNTER — Observation Stay
Admission: EM | Admit: 2019-07-15 | Discharge: 2019-07-16 | Disposition: A | Discharge status: Home / Self Care | Payer: Medicaid Other | Attending: Obstetrics & Gynecology | Admitting: Obstetrics & Gynecology

## 2019-07-15 ENCOUNTER — Other Ambulatory Visit: Payer: Self-pay

## 2019-07-15 ENCOUNTER — Encounter: Payer: Self-pay | Admitting: Obstetrics & Gynecology

## 2019-07-15 ENCOUNTER — Observation Stay: Payer: Medicaid Other

## 2019-07-15 DIAGNOSIS — O4693 Antepartum hemorrhage, unspecified, third trimester: Secondary | ICD-10-CM | POA: Diagnosis present

## 2019-07-15 DIAGNOSIS — Z20822 Contact with and (suspected) exposure to covid-19: Secondary | ICD-10-CM | POA: Diagnosis not present

## 2019-07-15 DIAGNOSIS — F17210 Nicotine dependence, cigarettes, uncomplicated: Secondary | ICD-10-CM | POA: Diagnosis not present

## 2019-07-15 DIAGNOSIS — O99333 Smoking (tobacco) complicating pregnancy, third trimester: Secondary | ICD-10-CM | POA: Insufficient documentation

## 2019-07-15 DIAGNOSIS — Z3A29 29 weeks gestation of pregnancy: Secondary | ICD-10-CM | POA: Insufficient documentation

## 2019-07-15 DIAGNOSIS — O4413 Placenta previa with hemorrhage, third trimester: Secondary | ICD-10-CM | POA: Diagnosis present

## 2019-07-15 LAB — CBC
HCT: 27.7 % — ABNORMAL LOW (ref 36.0–46.0)
Hemoglobin: 9.3 g/dL — ABNORMAL LOW (ref 12.0–15.0)
MCH: 29.4 pg (ref 26.0–34.0)
MCHC: 33.6 g/dL (ref 30.0–36.0)
MCV: 87.7 fL (ref 80.0–100.0)
Platelets: 182 10*3/uL (ref 150–400)
RBC: 3.16 MIL/uL — ABNORMAL LOW (ref 3.87–5.11)
RDW: 12.7 % (ref 11.5–15.5)
WBC: 17.1 10*3/uL — ABNORMAL HIGH (ref 4.0–10.5)
nRBC: 0 % (ref 0.0–0.2)

## 2019-07-15 LAB — URINE DRUG SCREEN, QUALITATIVE (ARMC ONLY)
Amphetamines, Ur Screen: NOT DETECTED
Barbiturates, Ur Screen: NOT DETECTED
Benzodiazepine, Ur Scrn: NOT DETECTED
Cannabinoid 50 Ng, Ur ~~LOC~~: NOT DETECTED
Cocaine Metabolite,Ur ~~LOC~~: NOT DETECTED
MDMA (Ecstasy)Ur Screen: NOT DETECTED
Methadone Scn, Ur: NOT DETECTED
Opiate, Ur Screen: NOT DETECTED
Phencyclidine (PCP) Ur S: NOT DETECTED
Tricyclic, Ur Screen: NOT DETECTED

## 2019-07-15 LAB — TYPE AND SCREEN
ABO/RH(D): O POS
Antibody Screen: NEGATIVE

## 2019-07-15 LAB — RESPIRATORY PANEL BY RT PCR (FLU A&B, COVID)
Influenza A by PCR: NEGATIVE
Influenza B by PCR: NEGATIVE
SARS Coronavirus 2 by RT PCR: NEGATIVE

## 2019-07-15 MED ORDER — CALCIUM CARBONATE ANTACID 500 MG PO CHEW: 2.0000 | CHEWABLE_TABLET | ORAL | Status: DC | PRN

## 2019-07-15 MED ORDER — BETAMETHASONE SOD PHOS & ACET 6 (3-3) MG/ML IJ SUSP
12.0000 mg | INTRAMUSCULAR | Status: DC
  Administered 2019-07-15: 12 mg via INTRAMUSCULAR
  Filled 2019-07-15: qty 5

## 2019-07-15 MED ORDER — MAGNESIUM SULFATE BOLUS VIA INFUSION
6.0000 g | Freq: Once | INTRAVENOUS | Status: AC
  Administered 2019-07-15: 6 g via INTRAVENOUS
  Filled 2019-07-15: qty 1000

## 2019-07-15 MED ORDER — LACTATED RINGERS IV SOLN
500.0000 mL/h | Freq: Once | INTRAVENOUS | Status: AC
  Administered 2019-07-15: 999 mL/h via INTRAVENOUS

## 2019-07-15 MED ORDER — DOCUSATE SODIUM 100 MG PO CAPS: 100.0000 mg | ORAL_CAPSULE | Freq: Every day | ORAL | Status: DC

## 2019-07-15 MED ORDER — ZOLPIDEM TARTRATE 5 MG PO TABS: 5.0000 mg | ORAL_TABLET | Freq: Every evening | ORAL | Status: DC | PRN

## 2019-07-15 MED ORDER — MAGNESIUM SULFATE 40 GM/1000ML IV SOLN
1.0000 g/h | INTRAVENOUS | Status: DC
  Filled 2019-07-15: qty 1000

## 2019-07-15 MED ORDER — PRENATAL MULTIVITAMIN CH: 1.0000 | ORAL_TABLET | Freq: Every day | ORAL | Status: DC

## 2019-07-15 MED ORDER — ACETAMINOPHEN 325 MG PO TABS: 650.0000 mg | ORAL_TABLET | ORAL | Status: DC | PRN

## 2019-07-15 MED ORDER — LACTATED RINGERS IV SOLN: INTRAVENOUS | Status: DC

## 2019-07-15 NOTE — Progress Notes (Signed)
Beside Report given to B Materials engineer.

## 2019-07-15 NOTE — OB Triage Note (Signed)
Pt is a 29yo G5P3 at [redacted]w[redacted]d that presents from ED with c/o of passing a clot and active bleeding. Upon assessment pt had a pad on that was saturated with bright red blood. Pt states she "picked up my living room." Then the she went to the bathroom and passed a blood clot about the size of a half dollar. Pt states" I went went next door to see my mother in law and felt a gush. I went home and there was a lot of blood." Pt states decreased fetal movement today but states she has only had a banana to eat today. RN had pt apply a clean peripad and will notify CNM of pt arrival. Vitals Stable and initial FHT 135 with monitors applied and assessing.

## 2019-07-15 NOTE — Progress Notes (Addendum)
antepartum progress note  Patient here for vaginal bleeding with placenta previa.  Has passed small to moderate sized clots today, throughout the day whenever she stands or uses the bathroom.  No bright red bleeding or hemorrhage.    Ultrasound today shows 1300g fetus,  with 3cm from cervix to placenta on intitial report however this is not what is visible in imaging. Formal report has not yet been read/reported. Cervix is closed and 3.5cm in length AFI 11cm AUA [id number] FHR [id number] cephalic         Best to assume previa still since that distance is ill-defined.  She is -hemodynamically stable -no loner bleeding or passing any clots.  Her clots seem to be forming in the vagina and passing with her sitting on the toilet.  This provides reassurance that her bleeding is not brisk.   -s/p betamethasone -on mag for neuroprotection, although my suspicion is that she will NOT be delivering today.  Can d/c this as long as she remains blood-free and has no fetal concerns.   -would like to go back to work soon ([employer]) and home- she does not wish to stay in obs at the hosptial.   -Rh positive.   Will d/c mag after 12 hour infusion Neonatology said they could accommodate a 1300g baby should there be emergent delivery. No need to transfer Continuous toco/EFM Will reassess bleeding status in the morning.  She is not bleeding currently and has not passed any clots since earlier today.  If she remains hemostatic, will consider discharge.  ----- Larey Days, MD, [place] Attending Obstetrician and Gynecologist [facility], Department of [facility]

## 2019-07-15 NOTE — H&P (Signed)
HISTORY AND PHYSICAL NOTE  History of Present Illness: Toni Blake is a 29 y.o. [phone] at [redacted]w[redacted]d admitted for vaginal bleeding in third trimester, placenta previa.   Patient reports the fetal movement as active. Patient reports uterine contraction  activity as none. Patient reports  vaginal bleeding as flow heavier than a period, moderate clot of 3-4 cm, changing pads about 1-2 hours. . Patient describes fluid per vagina as None. Fetal presentation is Transverse per [place] on 07/10/2019.  Patient Active Problem List   Diagnosis Date Noted  . Vaginal bleeding in pregnancy, third trimester 07/14/2019  . Consultation for sterilization 12/18/2018  . Postpartum care following vaginal delivery 08/30/2018  . Indication for care in labor and delivery, antepartum 07/21/2018  . Panic disorder 01/07/2016  . Tobacco use disorder 01/07/2016  . Adjustment disorder with depressed mood 01/06/2016  . Suicidal ideation 01/06/2016  . Migraine 01/06/2016  . Cocaine abuse ([place]) 01/06/2016    Past Medical History:  Diagnosis Date  . Anemia   . Anxiety   . Depression   . Family history of adverse reaction to anesthesia   . Migraine     Past Surgical History:  Procedure Laterality Date  . 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 LAPAROSCOPIC TUBAL LIGATION Bilateral 12/18/2018   Procedure: LAPAROSCOPIC TUBAL LIGATION BILATERAL;  Surgeon: Will Bonnet, MD;  Location: [facility] ORS;  Service: Gynecology;  Laterality: Bilateral;    OB History  Gravida Para Term Preterm AB Living  5 3 3   1 3   SAB TAB Ectopic Multiple Live Births  1     0 3    # Outcome Date GA Lbr Len/2nd Weight Sex Delivery Anes PTL Lv  5 Current           4 Term 08/30/18 [redacted]w[redacted]d / 00:14 2970 g M Vag-Spont None  LIV  3 Term 01/04/17 [redacted]w[redacted]d  3260 g F Vag-Spont   LIV  2 Term 07/28/11 [redacted]w[redacted]d  3260 g F Vag-Spont   LIV     Complications: Oligohydramnios  1 SAB      SAB       Social History   Socioeconomic History  .  Marital status: Single    Spouse name: Not on file  . Number of children: 3  . Years of education: Not on file  . Highest education level: Not on file  Occupational History  . Not on file  Tobacco Use  . Smoking status: Current Every Day Smoker    Packs/day: 1.00    Types: Cigarettes  . Smokeless tobacco: Never Used  Substance and Sexual Activity  . Alcohol use: No  . Drug use: No    Comment: Past use of MJ and cocaine  . Sexual activity: Not Currently    Partners: Male    Birth control/protection: Surgical    Comment: BTL  Other Topics Concern  . Not on file  Social History Narrative  . Not on file   Social Determinants of Health   Financial Resource Strain: Low Risk   . Difficulty of Paying Living Expenses: Not hard at all  Food Insecurity: No Food Insecurity  . Worried About Charity fundraiser in the Last Year: Never true  . Ran Out of Food in the Last Year: Never true  Transportation Needs: No Transportation Needs  . Lack of Transportation (Medical): No  . Lack of Transportation (Non-Medical): No  Physical Activity: Insufficiently Active  . Days of Exercise per Week: 4 days  .  Minutes of Exercise per Session: 30 min  Stress: No Stress Concern Present  . Feeling of Stress : Only a little  Social Connections: Unknown  . Frequency of Communication with Friends and Family: Patient refused  . Frequency of Social Gatherings with Friends and Family: Patient refused  . Attends Religious Services: Patient refused  . Active Member of Clubs or Organizations: Patient refused  . Attends Archivist Meetings: Patient refused  . Marital Status: Patient refused    Family History  Problem Relation Age of Onset  . Depression Mother   . Migraines Mother   . Asthma Father   . Depression Sister   . Diabetes Paternal Grandmother     No Known Allergies  Medications Prior to Admission  Medication Sig Dispense Refill Last Dose  . Prenatal Vit-Fe Fumarate-FA  (MULTIVITAMIN-PRENATAL) 27-0.8 MG TABS tablet Take 1 tablet by mouth daily at 12 noon.   07/14/2019 at Unknown time  . diphenhydrAMINE (BENADRYL ALLERGY) 25 MG tablet Take 1 tablet (25 mg total) by mouth every 8 (eight) hours as needed for up to 5 days. (Patient not taking: Reported on 12/26/2018) 30 tablet 0   . famotidine (PEPCID) 20 MG tablet Take 1 tablet (20 mg total) by mouth 2 (two) times daily for 5 days. (Patient not taking: Reported on 12/26/2018) 60 tablet 1   . HYDROcodone-acetaminophen (NORCO) 5-325 MG tablet Take 1 tablet by mouth every 8 (eight) hours as needed for moderate pain. (Patient not taking: Reported on 12/26/2018) 10 tablet 0 Not Taking at Unknown time  . ibuprofen (ADVIL) 600 MG tablet Take 1 tablet (600 mg total) by mouth every 6 (six) hours as needed for mild pain or cramping. (Patient not taking: Reported on 12/26/2018) 30 tablet 0 Not Taking at Unknown time  . traMADol (ULTRAM) 50 MG tablet Take by mouth every 6 (six) hours as needed.   Not Taking at Unknown time    Review of Systems - Negative except vaginal bleeding.   Vitals:  BP 113/65   Pulse 95   Temp 98.2 F (36.8 C) (Oral)   LMP 12/01/2018 (Exact Date)  Physical Examination: CONSTITUTIONAL: Well-developed, well-nourished female in no acute distress.  HENT:  Normocephalic, atraumatic, External right and left ear normal. Oropharynx is clear and moist EYES: Conjunctivae and EOM are normal. Pupils are equal, round, and reactive to light. No scleral icterus.  NECK: Normal range of motion, supple, no masses SKIN: Skin is warm and dry. No rash noted. Not diaphoretic. No erythema. No pallor. [place]: Alert and oriented to person, place, and time. Normal reflexes, muscle tone coordination. No cranial nerve deficit noted. PSYCHIATRIC: Normal mood and affect, mood appropriate for situation. Normal behavior. Normal judgment and thought content. CARDIOVASCULAR: Normal heart rate noted, regular rhythm RESPIRATORY: Effort  and breath sounds normal, no problems with respiration noted ABDOMEN: Soft, nontender, nondistended, gravid. MUSCULOSKELETAL: Normal range of motion. No edema and no tenderness. 2+ distal pulses.  Cervix: Evaluated by sterile speculum exam and found to be closed/ Long/ and fetal, moderate amount of vaginal bleeding noted, small clot in vaginal vault, bleeding noted from cervical OS  Membranes:intact Fetal Monitoring:Baseline: 130 bpm, moderate variability, decels absent  Tocometer: Flat  Labs:  CBC and type and screen pending   Imaging Studies: US performed in office on 07/10/2019: [place]: low lying placenta at anatomy scan AFI 12.1cm (30%) Placenta: anterior previa Position: transverse, head to maternal right FHT 149bpm   Assessment and Plan: Patient Active Problem List   Diagnosis  Date Noted  . Vaginal bleeding in pregnancy, third trimester 07/14/2019  . Consultation for sterilization 12/18/2018  . Postpartum care following vaginal delivery 08/30/2018  . Indication for care in labor and delivery, antepartum 07/21/2018  . Panic disorder 01/07/2016  . Tobacco use disorder 01/07/2016  . Adjustment disorder with depressed mood 01/06/2016  . Suicidal ideation 01/06/2016  . Migraine 01/06/2016  . Cocaine abuse ([place]) 01/06/2016   Admit to Antenatal S/P Betamethasone x 2 doses Magnesium sulfate for CP prophylaxis Will do pad counts to monitor vaginal bleeding [place] ordered for EFW Will consult NICU about observing here versus transfer to tertiary center  Dr. Leonides Schanz updated on plan of care  Routine antenatal care  Drinda Butts, CNM Certified Nurse Midwife [facility]

## 2019-07-15 NOTE — Progress Notes (Signed)
[place] at bedside per MD order

## 2019-07-16 DIAGNOSIS — O4413 Placenta previa with hemorrhage, third trimester: Secondary | ICD-10-CM | POA: Diagnosis not present

## 2019-07-16 NOTE — Discharge Summary (Signed)
RN reviewed discharge instructions with patient. Gave patient opportunity for questions. All questions answered at this time. Pt verbalized understanding. Pt discharge home with her significant other.

## 2019-07-28 ENCOUNTER — Other Ambulatory Visit: Payer: Self-pay

## 2019-07-28 ENCOUNTER — Observation Stay
Admission: EM | Admit: 2019-07-28 | Discharge: 2019-07-29 | Disposition: A | Discharge status: Home / Self Care | Payer: Medicaid Other | Attending: Certified Nurse Midwife | Admitting: Certified Nurse Midwife

## 2019-07-28 DIAGNOSIS — F17210 Nicotine dependence, cigarettes, uncomplicated: Secondary | ICD-10-CM | POA: Insufficient documentation

## 2019-07-28 DIAGNOSIS — O99333 Smoking (tobacco) complicating pregnancy, third trimester: Secondary | ICD-10-CM | POA: Insufficient documentation

## 2019-07-28 DIAGNOSIS — Z3A31 31 weeks gestation of pregnancy: Secondary | ICD-10-CM | POA: Insufficient documentation

## 2019-07-28 DIAGNOSIS — O4403 Placenta previa specified as without hemorrhage, third trimester: Secondary | ICD-10-CM

## 2019-07-28 DIAGNOSIS — O4413 Placenta previa with hemorrhage, third trimester: Principal | ICD-10-CM | POA: Insufficient documentation

## 2019-07-28 DIAGNOSIS — O4693 Antepartum hemorrhage, unspecified, third trimester: Secondary | ICD-10-CM | POA: Diagnosis present

## 2019-07-28 NOTE — OB Triage Note (Incomplete)
Pt is 5/3 EDC 5/20 (30.5) who presents with vaginal bleeding x 1 lg episode tonight around 1600 then continued scant bleeding. Has a previa and was here last week and discharged after 24 stay with Mag and BMZ.  ?Next appoint this Friday at [facility] ?

## 2019-07-29 ENCOUNTER — Observation Stay: Payer: Medicaid Other

## 2019-07-29 DIAGNOSIS — F17210 Nicotine dependence, cigarettes, uncomplicated: Secondary | ICD-10-CM | POA: Diagnosis not present

## 2019-07-29 DIAGNOSIS — O99333 Smoking (tobacco) complicating pregnancy, third trimester: Secondary | ICD-10-CM | POA: Diagnosis not present

## 2019-07-29 DIAGNOSIS — O4413 Placenta previa with hemorrhage, third trimester: Secondary | ICD-10-CM | POA: Diagnosis present

## 2019-07-29 DIAGNOSIS — Z3A31 31 weeks gestation of pregnancy: Secondary | ICD-10-CM | POA: Diagnosis not present

## 2019-07-29 LAB — PREPARE RBC (CROSSMATCH)

## 2019-07-29 LAB — CBC
HCT: 26 % — ABNORMAL LOW (ref 36.0–46.0)
Hemoglobin: 8.7 g/dL — ABNORMAL LOW (ref 12.0–15.0)
MCH: 29.3 pg (ref 26.0–34.0)
MCHC: 33.5 g/dL (ref 30.0–36.0)
MCV: 87.5 fL (ref 80.0–100.0)
Platelets: 201 10*3/uL (ref 150–400)
RBC: 2.97 MIL/uL — ABNORMAL LOW (ref 3.87–5.11)
RDW: 12.7 % (ref 11.5–15.5)
WBC: 14.6 10*3/uL — ABNORMAL HIGH (ref 4.0–10.5)
nRBC: 0 % (ref 0.0–0.2)

## 2019-07-29 MED ORDER — SODIUM CHLORIDE 0.9% IV SOLUTION: Freq: Once | INTRAVENOUS | Status: AC

## 2019-07-29 MED ORDER — FERROUS SULFATE 325 (65 FE) MG PO TBEC: 325.0000 mg | DELAYED_RELEASE_TABLET | Freq: Two times a day (BID) | ORAL | 3 refills | Status: AC

## 2019-07-29 NOTE — Progress Notes (Signed)
Pt. Blood transfusion is complete. Obtained post transfusion vitals. Vitals WNL. Will continue to monitor.

## 2019-07-29 NOTE — Discharge Summary (Signed)
  Pt observed with repeat VAginal bleeding yesterday and presented to L+D . Pt with a known placenta previa . Third visit for the same  HCT this am 26.0 Since on L+D she received one unit of blood  And bleeding has stopped . Reactive nST  Discharge dx: [redacted] weeks EGA with placenta previa and intermittent bleeding  Currently stable . High risk for continued bleeding . I re counseled the pt that her increased activity level may increase additional bleeding .My recommendation is limited home activity . No work .  Precautions given to RTC / L+D for any additional bleeding or decrease fetal movt  She expresses understanding  Ferrous sulfate 325 mg BID + Vit C

## 2019-07-29 NOTE — Progress Notes (Signed)
  Toni Blake is a 29 y.o. [phone] at [redacted]w[redacted]d who is admitted for a second episode of heavy vaginal  bleeding in the present of a complete placenta previa.   Estimated Date of Delivery: 09/26/19 LMP: 12/18/18  Subjective: Pt seems agitated and wants to go home. Her husband works and there is no one to look after her other children.  She continue to reports that bleeding has been scant to none at all.   Objective: BP (!) 95/52 (BP Location: Left Arm)   Pulse 79   Temp 98 F (36.7 C) (Oral)   Resp 16   Ht 5\' 5"  (1.651 m)   Wt 72.1 kg   LMP 12/01/2018 (Exact Date)   SpO2 99%   BMI 26.46 kg/m   Fetal Assessment: FHT:  FHR: 140 bpm, variability: moderate,  accelerations:  Present,  decelerations:  Absent Category/reactivity:  Category I UC:   quiet   Labs: Lab Results  Component Value Date   WBC 14.6 (H) 07/29/2019   HGB 8.7 (L) 07/29/2019   HCT 26.0 (L) 07/29/2019   MCV 87.5 07/29/2019   PLT 201 07/29/2019    Assessment / Plan: 29 y.o.[redacted]w[redacted]d here for antenatal surveillance during pregnancy.  Principle diagnosis:vaginal bleeding in third trimester, placenta previa  C/w Dr. Ouida Sills - plan to continue obs of fetal status and bleeding, order 1 unit of blood   Known placenta previawith vaginal bleeding  Hgb 8.7 Hct 26.0   1 unit of blood ordered  Labor  Not present  Continue tocometry  Fetal Wellbeing  Reactive NST, reassuring for GA  Continue fetal monitoring  Maternal status  Clear liquids ordered  Clydene Laming, CNM 07/29/2019, 1:44 PM

## 2019-07-29 NOTE — Progress Notes (Signed)
Pt returned to room from ultrasound and does not need to be put back on fetal monitoring per provider. Pt requests to eat. Provider to review ultrasound results and determine if patient is able to eat. Will continue to monitor.

## 2019-07-29 NOTE — Progress Notes (Signed)
Pt transported to Ultrasound.  

## 2019-07-29 NOTE — Progress Notes (Signed)
ANTEPARTUM PROGRESS NOTE  Toni Blake is a 29 y.o. [phone] at [redacted]w[redacted]d who is admitted for a second episode of heavy vaginal  bleeding in the present of a complete placenta previa.   Estimated Date of Delivery: 09/26/19 LMP: 12/18/18  Length of Stay:  0 Days. Admitted 07/28/2019 @ 2306  Subjective: Location: vagina Onset/timing: 1600 yesterday Duration: constant Quality: bright red bleeding with 1 large clot Severity: moderate to severe Aggravating or alleviating conditions: none Associated signs/symptoms: left sided abdominal pain Context: Toni Blake has a known placenta previa. She was previously admitted on 07/14/2019 and 07/15/2019 for vaginal bleeding. After being discharged on 07/16/2019, she reports that she continued to have spotting/light bleeding of mostly brown blood and discharge. At the beginning of this week, she reports that she started to have some bright red bleeding, with a moderate sized clot on Monday 07/27/2019. Since then, she has had light bright red bleeding. Yesterday afternoon around 1600 she had an episode of heavy bright red bleeding, where she filled up a pad in about 45 minutes. She has changed her pad twice since then. She reports intermittent left sided abdominal pain, which sometimes seems to coincide with increased vaginal bleeding. She has not been working.  She reports that bedrest has occurred for about 50% of each day. Bleeding has continued to be scant through out today. Pt is eager to get home today.  She reports:  -active fetal movement -no leakage of fluid -no contractions  Vitals:  BP (!) 95/52   Pulse 79   Temp 98 F (36.7 C) (Oral)   Resp 16   LMP 12/01/2018 (Exact Date)  Physical Examination: General:   alert  Skin:  normal  Neurologic:    Alert & oriented x 3  Lungs:   nl effort  Heart:   regular rate and rhythm  Abdomen:  normal findings: soft, non-tender  Pelvis:  Exam deferred.  Cervix: Deferred d/t previa  Extremities: : non-tender,  symmetric    Fetal monitoring: FHR: 135 bpm, Variability: moderate, Accelerations: Present, Decelerations: Absent Uterine activity: quiet  Results for orders placed or performed during the hospital encounter of 07/28/19 (from the past 48 hour(s))  CBC     Status: Abnormal   Collection Time: 07/29/19 12:55 AM  Result Value Ref Range   WBC 14.6 (H) 4.0 - 10.5 K/uL   RBC 2.97 (L) 3.87 - 5.11 MIL/uL   Hemoglobin 8.7 (L) 12.0 - 15.0 g/dL   HCT 26.0 (L) 36.0 - 46.0 %   MCV 87.5 80.0 - 100.0 fL   MCH 29.3 26.0 - 34.0 pg   MCHC 33.5 30.0 - 36.0 g/dL   RDW 12.7 11.5 - 15.5 %   Platelets 201 150 - 400 K/uL   nRBC 0.0 0.0 - 0.2 %    Comment: Performed at [facility], [address] [place], [place] [address]      Current scheduled medications none  I have reviewed the patient's current medications.  A/P: 29 y.o. [redacted]w[redacted]d here for antenatal surveillance during pregnancy.  Principle diagnosis: vaginal bleeding in third trimester, placenta previa  Labor ? Not present ? Continue tocometry   Fetal Wellbeing ? Reactive NST, reassuring for GA ? Continue fetal monitoring  Known placenta previawith vaginal bleeding ? Rh positive ? CBCordered  07/10/2019: WBC 12.4 Hgb 9.6 Hct 28.7 Plt 202  07/14/2019:WBC 12.7Hgb 10.0Hct 30.0Plt 194  07/15/2019: WBC 17.1 Hgb 9.3 Hct 27.7 Plt 182  07/29/2019: WBC 14.6 Hgb 8.7 Hct 26.0 Plt 201 ?  Betamethasone given 07/14/2019 & 07/15/2019  Toni Blake, CNM 07/29/2019 8:25 AM

## 2019-07-29 NOTE — Progress Notes (Signed)
Toni Blake is a 29 y.o. female. She is at [redacted]w[redacted]d gestation. Patient's last menstrual period was 12/01/2018 (exact date). Estimated Date of Delivery: 09/26/19  Prenatal care site: [facility]   Chief complaint: vaginal bleeding Location: vagina Onset/timing: 1600 yesterday Duration: constant Quality: bright red bleeding Severity: moderate to severe Aggravating or alleviating conditions: none Associated signs/symptoms: left sided abdominal pain Context: Toni Blake has a known placenta previa. She was previously admitted on 07/14/2019 and 07/15/2019 for vaginal bleeding. After being discharged on 07/16/2019, she reports that she continued to have spotting/light bleeding of mostly brown blood and discharge. At the beginning of this week, she reports that she started to have some bright red bleeding, with a moderate sized clot on Monday 07/27/2019. Since then, she has had light bright red bleeding. Yesterday afternoon around 1600 she had an episode of heavy bright red bleeding, where she filled up a pad in about 45 minutes. She has changed her pad twice since then. She reports intermittent left sided abdominal pain, which sometimes seems to coincide with increased vaginal bleeding. She has not been working.   S: Resting comfortably.   She reports:  -active fetal movement -no leakage of fluid -no contractions  Maternal Medical History:   Past Medical History:  Diagnosis Date  . Anemia   . Anxiety   . Depression   . Family history of adverse reaction to anesthesia   . Migraine     Past Surgical History:  Procedure Laterality Date  . 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 LAPAROSCOPIC TUBAL LIGATION Bilateral 12/18/2018   Procedure: LAPAROSCOPIC TUBAL LIGATION BILATERAL;  Surgeon: Will Bonnet, MD;  Location: [facility] ORS;  Service: Gynecology;  Laterality: Bilateral;    Allergies  Allergen Reactions  . Vicodin [Hydrocodone-Acetaminophen] Rash    Prior to  Admission medications   Medication Sig Start Date End Date Taking? Authorizing Provider  diphenhydrAMINE (BENADRYL ALLERGY) 25 MG tablet Take 1 tablet (25 mg total) by mouth every 8 (eight) hours as needed for up to 5 days. Patient not taking: Reported on 12/26/2018 12/21/18 12/26/18  Lannie Fields, PA-C  famotidine (PEPCID) 20 MG tablet Take 1 tablet (20 mg total) by mouth 2 (two) times daily for 5 days. Patient not taking: Reported on 12/26/2018 12/21/18 12/26/18  Lannie Fields, PA-C  HYDROcodone-acetaminophen [facility]) 5-325 MG tablet Take 1 tablet by mouth every 8 (eight) hours as needed for moderate pain. Patient not taking: Reported on 12/26/2018 12/18/18   Will Bonnet, MD  ibuprofen (ADVIL) 600 MG tablet Take 1 tablet (600 mg total) by mouth every 6 (six) hours as needed for mild pain or cramping. Patient not taking: Reported on 12/26/2018 12/18/18   Will Bonnet, MD  Prenatal Vit-Fe Fumarate-FA (MULTIVITAMIN-PRENATAL) 27-0.8 MG TABS tablet Take 1 tablet by mouth daily at 12 noon.    [provider]  traMADol (ULTRAM) 50 MG tablet Take by mouth every 6 (six) hours as needed.    [provider]     Social History: She  reports that she has been smoking cigarettes. She has been smoking about 1.00 pack per day. She has never used smokeless tobacco. She reports that she does not drink alcohol or use drugs.  Family History: family history includes Asthma in her father; Depression in her mother and sister; Diabetes in her paternal grandmother; Migraines in her mother.   Review of Systems: A full review of systems was performed and negative except as noted in  the HPI.     O:  BP (!) 100/59 (BP Location: Right Arm)   Pulse 96   Temp 99.1 F (37.3 C) (Oral)   Resp 16   LMP 12/01/2018 (Exact Date)  No results found for this or any previous visit (from the past 48 hour(s)).   Constitutional: NAD, AAOx3  HE/ENT: extraocular movements grossly intact, moist mucous  membranes CV: RRR PULM: normal respiratory effort, CTABL     Abd: gravid, non-tender, non-distended, soft      Ext: Non-tender, Nonedmeatous   Psych: mood appropriate, speech normal Pelvic deferred  NST/fetal monitoring:  Baseline: 130-150bpm Variability: moderate Accelerations: 15x15 present  Decelerations: variable Time: 61mins Toco: irritability with occasional contraction   A/P: 29 y.o. [redacted]w[redacted]d here for antenatal surveillance during pregnancy.  Principle diagnosis: vaginal bleeding in third trimester, placenta previa  Labor  Not present  Continue tocometry   Fetal Wellbeing  Reactive NST, reassuring for GA  Continue fetal monitoring  Labor ? Not present  Known placenta previa with vaginal bleeding ? Rh positive ? Last ultrasound 07/15/2019 with persistent anterior previa ? CBC ordered  07/10/2019: WBC 12.4 Hgb 9.6 Hct 28.7 Plt 202  07/14/2019: WBC 12.7 Hgb 10.0 Hct 30.0 Plt 194  07/15/2019: WBC 17.1 Hgb 9.3 Hct 27.7 Plt 182 ? Betamethasone given 07/14/2019 & 07/15/2019  ? Repeat ultrasound ordered to assess for placenta position and integrity    Dr. Leafy Ro notified of patient's admission.    Toni Blake 07/29/2019 1:03 AM  ----- Toni Blake, CNM Certified Nurse Midwife [facility], Department of [facility]

## 2019-07-29 NOTE — Discharge Instructions (Signed)
Placenta Previa Placenta previa is a condition in which the placenta implants in the lower part of the uterus in pregnant women. The placenta either partially or completely covers the opening to the cervix. This is a problem because the baby must pass through the cervix during delivery. There are three types of placenta previa:  Marginal placenta previa. The placenta reaches within an inch (2.5 cm) of the cervical opening but does not cover it.  Partial placenta previa. The placenta covers part of the cervical opening.  Complete placenta previa. The placenta covers the entire cervical opening. If the previa is marginal or partial and it is diagnosed in the first half of pregnancy, the placenta may move into a normal position as the pregnancy progresses and may no longer cover the cervix. It is important to keep all prenatal visits with your health care provider so you can be more closely monitored. What are the causes? The cause of this condition is not known. What increases the risk? This condition is more likely to develop in women who:  Are carrying more than one baby (multiples).  Have an abnormally shaped uterus.  Have scars on the lining of the uterus.  Have had surgeries involving the uterus, such as a cesarean delivery.  Have delivered a baby before.  Have a history of placenta previa.  Have smoked or used cocaine during pregnancy.  Are age 35 or older during pregnancy. What are the signs or symptoms? The main symptom of this condition is sudden, painless vaginal bleeding during the second half of pregnancy. The amount of bleeding can be very light at first, and it usually stops on its own. Heavier bleeding episodes may also happen. Some women with placenta previa may have no bleeding at all. How is this diagnosed?  This condition is diagnosed: ? From an ultrasound. This test uses sound waves to find where the placenta is located before you have any bleeding  episodes. ? During a checkup after vaginal bleeding is noticed.  If you are diagnosed with a partial or complete previa, digital exams with fingers will generally be avoided. Your health care provider will still perform a speculum exam.  If you did not have an ultrasound during your pregnancy, placenta previa may not be diagnosed until bleeding occurs during labor. How is this treated? Treatment for this condition may include:  Decreased activity.  Bed rest at home or in the hospital.  Pelvic rest. Nothing is placed inside the vagina during pelvic rest. This means not having sex and not using tampons or douches.  A blood transfusion to replace blood that you have lost (maternal blood loss).  A cesarean delivery. This may be performed if: ? The bleeding is heavy and cannot be controlled. ? The placenta completely covers the cervix.  Medicines to stop premature labor or to help the baby's lungs to mature. This treatment may be used if you need delivery before your pregnancy is full-term. Your treatment will be decided based on:  How much you are bleeding, or whether the bleeding has stopped.  How far along you are in your pregnancy.  The condition of your baby.  The type of placenta previa that you have.  Follow these instructions at home:  Get plenty of rest and lessen activity as told by your health care provider.  Stay on bed rest for as long as told by your health care provider.  Do not have sex, use tampons, use a douche, or place anything inside of   your vagina if your health care provider recommended pelvic rest.  Take over-the-counter and prescription medicines as told by your health care provider.  Keep all follow-up visits as told by your health care provider. This is important. Get help right away if:  You have vaginal bleeding, even if in small amounts and even if you have no pain.  You have cramping or regular contractions.  You have pain in your abdomen or  your lower back.  You have a feeling of increased pressure in your pelvis.  You have increased watery or bloody mucus from the vagina. This information is not intended to replace advice given to you by your health care provider. Make sure you discuss any questions you have with your health care provider. Document Revised: 04/12/2017 Document Reviewed: 11/12/2015 Elsevier Patient Education  2020 [employer]  

## 2019-07-29 NOTE — Discharge Summary (Signed)
RN reviewed discharge instructions with the patient. Gave patient opportunity for questions. All questions answered at this time. Pt verbalized understanding. Pt discharged home. 

## 2019-07-30 LAB — TYPE AND SCREEN
ABO/RH(D): O POS
Antibody Screen: NEGATIVE
Unit division: 0

## 2019-07-30 LAB — BPAM RBC
Blood Product Expiration Date: 202103192359
ISSUE DATE / TIME: 202103171449
Unit Type and Rh: 9500

## 2019-08-12 ENCOUNTER — Inpatient Hospital Stay: Payer: Medicaid Other | Admitting: Anesthesiology

## 2019-08-12 ENCOUNTER — Inpatient Hospital Stay
Admission: EM | Admit: 2019-08-12 | Discharge: 2019-08-14 | DRG: 784 | Disposition: A | Discharge status: Home / Self Care | Payer: Medicaid Other | Attending: Obstetrics and Gynecology | Admitting: Obstetrics and Gynecology

## 2019-08-12 ENCOUNTER — Encounter: Payer: Self-pay | Admitting: Obstetrics and Gynecology

## 2019-08-12 ENCOUNTER — Other Ambulatory Visit: Payer: Self-pay

## 2019-08-12 ENCOUNTER — Encounter
Admission: EM | Disposition: A | Discharge status: Home / Self Care | Payer: Self-pay | Source: Home / Self Care | Attending: Obstetrics and Gynecology

## 2019-08-12 DIAGNOSIS — Z302 Encounter for sterilization: Secondary | ICD-10-CM

## 2019-08-12 DIAGNOSIS — O4413 Placenta previa with hemorrhage, third trimester: Secondary | ICD-10-CM | POA: Diagnosis present

## 2019-08-12 DIAGNOSIS — O4693 Antepartum hemorrhage, unspecified, third trimester: Secondary | ICD-10-CM | POA: Diagnosis present

## 2019-08-12 DIAGNOSIS — Z20822 Contact with and (suspected) exposure to covid-19: Secondary | ICD-10-CM | POA: Diagnosis present

## 2019-08-12 DIAGNOSIS — D62 Acute posthemorrhagic anemia: Secondary | ICD-10-CM | POA: Diagnosis not present

## 2019-08-12 DIAGNOSIS — Z9889 Other specified postprocedural states: Secondary | ICD-10-CM

## 2019-08-12 DIAGNOSIS — F17210 Nicotine dependence, cigarettes, uncomplicated: Secondary | ICD-10-CM | POA: Diagnosis present

## 2019-08-12 DIAGNOSIS — O99334 Smoking (tobacco) complicating childbirth: Secondary | ICD-10-CM | POA: Diagnosis present

## 2019-08-12 DIAGNOSIS — Z3A33 33 weeks gestation of pregnancy: Secondary | ICD-10-CM

## 2019-08-12 DIAGNOSIS — O4411 Placenta previa with hemorrhage, first trimester: Secondary | ICD-10-CM | POA: Diagnosis present

## 2019-08-12 DIAGNOSIS — O9081 Anemia of the puerperium: Secondary | ICD-10-CM | POA: Diagnosis not present

## 2019-08-12 DIAGNOSIS — O4400 Placenta previa specified as without hemorrhage, unspecified trimester: Secondary | ICD-10-CM | POA: Diagnosis present

## 2019-08-12 DIAGNOSIS — O4431 Partial placenta previa with hemorrhage, first trimester: Secondary | ICD-10-CM | POA: Diagnosis present

## 2019-08-12 HISTORY — PX: BILATERAL SALPINGECTOMY: SHX5743

## 2019-08-12 LAB — CBC WITH DIFFERENTIAL/PLATELET
Abs Immature Granulocytes: 0.27 10*3/uL — ABNORMAL HIGH (ref 0.00–0.07)
Basophils Absolute: 0.1 10*3/uL (ref 0.0–0.1)
Basophils Relative: 0 %
Eosinophils Absolute: 0.2 10*3/uL (ref 0.0–0.5)
Eosinophils Relative: 1 %
HCT: 30.2 % — ABNORMAL LOW (ref 36.0–46.0)
Hemoglobin: 9.9 g/dL — ABNORMAL LOW (ref 12.0–15.0)
Immature Granulocytes: 2 %
Lymphocytes Relative: 21 %
Lymphs Abs: 3.3 10*3/uL (ref 0.7–4.0)
MCH: 29.1 pg (ref 26.0–34.0)
MCHC: 32.8 g/dL (ref 30.0–36.0)
MCV: 88.8 fL (ref 80.0–100.0)
Monocytes Absolute: 1 10*3/uL (ref 0.1–1.0)
Monocytes Relative: 6 %
Neutro Abs: 10.7 10*3/uL — ABNORMAL HIGH (ref 1.7–7.7)
Neutrophils Relative %: 70 %
Platelets: 200 10*3/uL (ref 150–400)
RBC: 3.4 MIL/uL — ABNORMAL LOW (ref 3.87–5.11)
RDW: 14.1 % (ref 11.5–15.5)
WBC: 15.4 10*3/uL — ABNORMAL HIGH (ref 4.0–10.5)
nRBC: 0 % (ref 0.0–0.2)

## 2019-08-12 LAB — CREATININE, SERUM
Creatinine, Ser: 0.55 mg/dL (ref 0.44–1.00)
GFR calc Af Amer: >60 mL/min (ref >60)
GFR calc non Af Amer: >60 mL/min (ref >60)

## 2019-08-12 LAB — RAPID HIV SCREEN (HIV 1/2 AB+AG)
HIV 1/2 Antibodies: NONREACTIVE
HIV-1 P24 Antigen - HIV24: NONREACTIVE

## 2019-08-12 LAB — SAMPLE TO BLOOD BANK

## 2019-08-12 SURGERY — Surgical Case
Anesthesia: Spinal

## 2019-08-12 MED ORDER — FENTANYL CITRATE (PF) 100 MCG/2ML IJ SOLN
INTRAMUSCULAR | Status: AC
  Filled 2019-08-12: qty 2

## 2019-08-12 MED ORDER — ZOLPIDEM TARTRATE 5 MG PO TABS: 5.0000 mg | ORAL_TABLET | Freq: Every evening | ORAL | Status: DC | PRN

## 2019-08-12 MED ORDER — SODIUM CHLORIDE 0.9% FLUSH: 3.0000 mL | INTRAVENOUS | Status: DC | PRN

## 2019-08-12 MED ORDER — CEFAZOLIN SODIUM-DEXTROSE 2-4 GM/100ML-% IV SOLN
2.0000 g | Freq: Once | INTRAVENOUS | Status: AC
  Administered 2019-08-12: 2 g via INTRAVENOUS
  Filled 2019-08-12: qty 100

## 2019-08-12 MED ORDER — TETANUS-DIPHTH-ACELL PERTUSSIS 5-2.5-18.5 LF-MCG/0.5 IM SUSP: 0.5000 mL | Freq: Once | INTRAMUSCULAR | Status: DC

## 2019-08-12 MED ORDER — FENTANYL CITRATE (PF) 100 MCG/2ML IJ SOLN
INTRAMUSCULAR | Status: DC | PRN
  Administered 2019-08-12: 15 ug via INTRATHECAL

## 2019-08-12 MED ORDER — MORPHINE SULFATE (PF) 0.5 MG/ML IJ SOLN
INTRAMUSCULAR | Status: DC | PRN
  Administered 2019-08-12: .1 mg via INTRATHECAL

## 2019-08-12 MED ORDER — SOD CITRATE-CITRIC ACID 500-334 MG/5ML PO SOLN
30.0000 mL | ORAL | Status: DC | PRN
  Administered 2019-08-12: 30 mL via ORAL

## 2019-08-12 MED ORDER — MORPHINE SULFATE (PF) 0.5 MG/ML IJ SOLN
INTRAMUSCULAR | Status: AC
  Filled 2019-08-12: qty 10

## 2019-08-12 MED ORDER — ONDANSETRON HCL 4 MG/2ML IJ SOLN: 4.0000 mg | Freq: Four times a day (QID) | INTRAMUSCULAR | Status: DC | PRN

## 2019-08-12 MED ORDER — SIMETHICONE 80 MG PO CHEW: 80.0000 mg | CHEWABLE_TABLET | ORAL | Status: DC

## 2019-08-12 MED ORDER — MORPHINE SULFATE (PF) 2 MG/ML IV SOLN: 1.0000 mg | INTRAVENOUS | Status: DC | PRN

## 2019-08-12 MED ORDER — OXYTOCIN 40 UNITS IN NORMAL SALINE INFUSION - SIMPLE MED
INTRAVENOUS | Status: AC
  Filled 2019-08-12: qty 1000

## 2019-08-12 MED ORDER — METHYLERGONOVINE MALEATE 0.2 MG/ML IJ SOLN
INTRAMUSCULAR | Status: AC
  Filled 2019-08-12: qty 1

## 2019-08-12 MED ORDER — CARBOPROST TROMETHAMINE 250 MCG/ML IM SOLN
INTRAMUSCULAR | Status: AC
  Filled 2019-08-12: qty 1

## 2019-08-12 MED ORDER — SIMETHICONE 80 MG PO CHEW
80.0000 mg | CHEWABLE_TABLET | Freq: Three times a day (TID) | ORAL | Status: DC
  Administered 2019-08-12 – 2019-08-14 (×6): 80 mg via ORAL
  Filled 2019-08-12 (×6): qty 1

## 2019-08-12 MED ORDER — SODIUM CHLORIDE (PF) 0.9 % IJ SOLN
INTRAMUSCULAR | Status: AC
  Filled 2019-08-12: qty 50

## 2019-08-12 MED ORDER — DIPHENHYDRAMINE HCL 25 MG PO CAPS: 25.0000 mg | ORAL_CAPSULE | ORAL | Status: DC | PRN

## 2019-08-12 MED ORDER — LIDOCAINE HCL (PF) 1 % IJ SOLN: 30.0000 mL | INTRAMUSCULAR | Status: DC | PRN

## 2019-08-12 MED ORDER — WITCH HAZEL-GLYCERIN EX PADS: 1.0000 "application " | MEDICATED_PAD | CUTANEOUS | Status: DC | PRN

## 2019-08-12 MED ORDER — LACTATED RINGERS IV SOLN: INTRAVENOUS | Status: DC

## 2019-08-12 MED ORDER — BUPIVACAINE IN DEXTROSE 0.75-8.25 % IT SOLN
INTRATHECAL | Status: DC | PRN
  Administered 2019-08-12: 1.6 mL via INTRATHECAL

## 2019-08-12 MED ORDER — BUPIVACAINE HCL (PF) 0.5 % IJ SOLN
INTRAMUSCULAR | Status: AC
  Filled 2019-08-12: qty 30

## 2019-08-12 MED ORDER — SOD CITRATE-CITRIC ACID 500-334 MG/5ML PO SOLN
ORAL | Status: AC
  Administered 2019-08-12: 30 mL
  Filled 2019-08-12: qty 30

## 2019-08-12 MED ORDER — ENOXAPARIN SODIUM 40 MG/0.4ML ~~LOC~~ SOLN
40.0000 mg | SUBCUTANEOUS | Status: DC
  Administered 2019-08-13 – 2019-08-14 (×2): 40 mg via SUBCUTANEOUS
  Filled 2019-08-12 (×2): qty 0.4

## 2019-08-12 MED ORDER — COCONUT OIL OIL
1.0000 "application " | TOPICAL_OIL | Status: DC | PRN
  Administered 2019-08-13: 1 via TOPICAL
  Filled 2019-08-12: qty 120

## 2019-08-12 MED ORDER — SODIUM CHLORIDE FLUSH 0.9 % IV SOLN
INTRAVENOUS | Status: DC | PRN
  Administered 2019-08-12: 15:00:00 100 mL

## 2019-08-12 MED ORDER — ONDANSETRON HCL 4 MG/2ML IJ SOLN: 4.0000 mg | Freq: Three times a day (TID) | INTRAMUSCULAR | Status: DC | PRN

## 2019-08-12 MED ORDER — BREAST MILK/FORMULA (FOR LABEL PRINTING ONLY): ORAL | Status: DC

## 2019-08-12 MED ORDER — KETOROLAC TROMETHAMINE 30 MG/ML IJ SOLN
30.0000 mg | Freq: Four times a day (QID) | INTRAMUSCULAR | Status: AC
  Administered 2019-08-12 – 2019-08-13 (×3): 30 mg via INTRAVENOUS
  Filled 2019-08-12 (×3): qty 1

## 2019-08-12 MED ORDER — OXYTOCIN BOLUS FROM INFUSION: 500.0000 mL | Freq: Once | INTRAVENOUS | Status: DC

## 2019-08-12 MED ORDER — BETAMETHASONE SOD PHOS & ACET 6 (3-3) MG/ML IJ SUSP
INTRAMUSCULAR | Status: AC
  Administered 2019-08-12: 12 mg via INTRAMUSCULAR
  Filled 2019-08-12: qty 5

## 2019-08-12 MED ORDER — OXYTOCIN 40 UNITS IN NORMAL SALINE INFUSION - SIMPLE MED
2.5000 [IU]/h | INTRAVENOUS | Status: AC
  Administered 2019-08-12 (×2): 2.5 [IU]/h via INTRAVENOUS

## 2019-08-12 MED ORDER — NALOXONE HCL 4 MG/10ML IJ SOLN
1.0000 ug/kg/h | INTRAVENOUS | Status: DC | PRN
  Filled 2019-08-12: qty 5

## 2019-08-12 MED ORDER — NALOXONE HCL 0.4 MG/ML IJ SOLN: 0.4000 mg | INTRAMUSCULAR | Status: DC | PRN

## 2019-08-12 MED ORDER — SIMETHICONE 80 MG PO CHEW: 80.0000 mg | CHEWABLE_TABLET | ORAL | Status: DC | PRN

## 2019-08-12 MED ORDER — ONDANSETRON HCL 4 MG/2ML IJ SOLN
INTRAMUSCULAR | Status: DC | PRN
  Administered 2019-08-12: 4 mg via INTRAVENOUS

## 2019-08-12 MED ORDER — OXYTOCIN 40 UNITS IN NORMAL SALINE INFUSION - SIMPLE MED
INTRAVENOUS | Status: DC | PRN
  Administered 2019-08-12: 500 mL via INTRAVENOUS

## 2019-08-12 MED ORDER — OXYTOCIN 40 UNITS IN NORMAL SALINE INFUSION - SIMPLE MED: 2.5000 [IU]/h | INTRAVENOUS | Status: DC

## 2019-08-12 MED ORDER — MEPERIDINE HCL 25 MG/ML IJ SOLN: 6.2500 mg | INTRAMUSCULAR | Status: DC | PRN

## 2019-08-12 MED ORDER — DIBUCAINE (PERIANAL) 1 % EX OINT: 1.0000 "application " | TOPICAL_OINTMENT | CUTANEOUS | Status: DC | PRN

## 2019-08-12 MED ORDER — SODIUM CHLORIDE 0.9 % IV SOLN
INTRAVENOUS | Status: DC | PRN
  Administered 2019-08-12: 50 ug/min via INTRAVENOUS

## 2019-08-12 MED ORDER — OXYCODONE HCL 5 MG PO TABS: 5.0000 mg | ORAL_TABLET | ORAL | Status: DC | PRN

## 2019-08-12 MED ORDER — SOD CITRATE-CITRIC ACID 500-334 MG/5ML PO SOLN: 30.0000 mL | ORAL | Status: DC | PRN

## 2019-08-12 MED ORDER — OXYCODONE HCL 5 MG PO TABS
5.0000 mg | ORAL_TABLET | ORAL | Status: DC | PRN
  Administered 2019-08-13 (×2): 10 mg via ORAL
  Administered 2019-08-13: 5 mg via ORAL
  Administered 2019-08-14 (×2): 10 mg via ORAL
  Administered 2019-08-14: 10:00:00 5 mg via ORAL
  Filled 2019-08-12: qty 2
  Filled 2019-08-12: qty 1
  Filled 2019-08-12: qty 2
  Filled 2019-08-12: qty 1
  Filled 2019-08-12 (×2): qty 2

## 2019-08-12 MED ORDER — BUPIVACAINE LIPOSOME 1.3 % IJ SUSP: 20.0000 mL | Freq: Once | INTRAMUSCULAR | Status: DC

## 2019-08-12 MED ORDER — ACETAMINOPHEN 325 MG PO TABS
650.0000 mg | ORAL_TABLET | Freq: Four times a day (QID) | ORAL | Status: AC
  Administered 2019-08-12 – 2019-08-13 (×3): 650 mg via ORAL
  Filled 2019-08-12 (×3): qty 2

## 2019-08-12 MED ORDER — PRENATAL MULTIVITAMIN CH
1.0000 | ORAL_TABLET | Freq: Every day | ORAL | Status: DC
  Administered 2019-08-13 – 2019-08-14 (×2): 1 via ORAL
  Filled 2019-08-12 (×2): qty 1

## 2019-08-12 MED ORDER — MENTHOL 3 MG MT LOZG
1.0000 | LOZENGE | OROMUCOSAL | Status: DC | PRN
  Filled 2019-08-12: qty 9

## 2019-08-12 MED ORDER — BETAMETHASONE SOD PHOS & ACET 6 (3-3) MG/ML IJ SUSP: 12.0000 mg | Freq: Once | INTRAMUSCULAR | Status: AC

## 2019-08-12 MED ORDER — KETOROLAC TROMETHAMINE 30 MG/ML IJ SOLN: 30.0000 mg | Freq: Four times a day (QID) | INTRAMUSCULAR | Status: AC

## 2019-08-12 MED ORDER — DIPHENHYDRAMINE HCL 25 MG PO CAPS: 50.0000 mg | ORAL_CAPSULE | Freq: Four times a day (QID) | ORAL | Status: DC | PRN

## 2019-08-12 MED ORDER — MISOPROSTOL 200 MCG PO TABS
ORAL_TABLET | ORAL | Status: AC
  Filled 2019-08-12: qty 5

## 2019-08-12 MED ORDER — LACTATED RINGERS IV SOLN: 500.0000 mL | INTRAVENOUS | Status: DC | PRN

## 2019-08-12 MED ORDER — DIPHENHYDRAMINE HCL 50 MG/ML IJ SOLN: 12.5000 mg | INTRAMUSCULAR | Status: DC | PRN

## 2019-08-12 MED ORDER — GABAPENTIN 300 MG PO CAPS
300.0000 mg | ORAL_CAPSULE | Freq: Every day | ORAL | Status: DC
  Administered 2019-08-12 – 2019-08-13 (×2): 300 mg via ORAL
  Filled 2019-08-12 (×2): qty 1

## 2019-08-12 MED ORDER — BUPIVACAINE LIPOSOME 1.3 % IJ SUSP
INTRAMUSCULAR | Status: AC
  Filled 2019-08-12: qty 20

## 2019-08-12 MED ORDER — HEMOSTATIC AGENTS (NO CHARGE) OPTIME
TOPICAL | Status: DC | PRN
  Administered 2019-08-12: 1 via TOPICAL

## 2019-08-12 MED ORDER — SENNOSIDES-DOCUSATE SODIUM 8.6-50 MG PO TABS
2.0000 | ORAL_TABLET | ORAL | Status: DC
  Administered 2019-08-13 – 2019-08-14 (×2): 2 via ORAL
  Filled 2019-08-12 (×2): qty 2

## 2019-08-12 MED ORDER — OXYCODONE HCL 5 MG PO TABS: 10.0000 mg | ORAL_TABLET | ORAL | Status: DC | PRN

## 2019-08-12 SURGICAL SUPPLY — 30 items
BARRIER ADHS 3X4 INTERCEED (GAUZE/BANDAGES/DRESSINGS) ×3 IMPLANT
CANISTER SUCT 3000ML PPV (MISCELLANEOUS) ×3 IMPLANT
CHLORAPREP W/TINT 26 (MISCELLANEOUS) ×3 IMPLANT
COVER WAND RF STERILE (DRAPES) ×3 IMPLANT
DRSG TELFA 3X8 NADH (GAUZE/BANDAGES/DRESSINGS) ×3 IMPLANT
ELECT CAUTERY BLADE 6.4 (BLADE) ×3 IMPLANT
ELECT REM PT RETURN 9FT ADLT (ELECTROSURGICAL) ×3
ELECTRODE REM PT RTRN 9FT ADLT (ELECTROSURGICAL) ×2 IMPLANT
GAUZE SPONGE 4X4 12PLY STRL (GAUZE/BANDAGES/DRESSINGS) ×3 IMPLANT
GLOVE BIO SURGEON STRL SZ8 (GLOVE) ×3 IMPLANT
GOWN STRL REUS W/ TWL LRG LVL3 (GOWN DISPOSABLE) ×4 IMPLANT
GOWN STRL REUS W/ TWL XL LVL3 (GOWN DISPOSABLE) ×2 IMPLANT
GOWN STRL REUS W/TWL LRG LVL3 (GOWN DISPOSABLE) ×2
GOWN STRL REUS W/TWL XL LVL3 (GOWN DISPOSABLE) ×1
NS IRRIG 1000ML POUR BTL (IV SOLUTION) ×3 IMPLANT
PACK C SECTION AR (MISCELLANEOUS) ×3 IMPLANT
PAD OB MATERNITY 4.3X12.25 (PERSONAL CARE ITEMS) ×3 IMPLANT
PAD PREP 24X41 OB/GYN DISP (PERSONAL CARE ITEMS) ×3 IMPLANT
PENCIL SMOKE ULTRAEVAC 22 CON (MISCELLANEOUS) ×3 IMPLANT
RETRACTOR TRAXI PANNICULUS (MISCELLANEOUS) IMPLANT
STAPLER INSORB 30 2030 C-SECTI (MISCELLANEOUS) ×3 IMPLANT
STRAP SAFETY 5IN WIDE (MISCELLANEOUS) ×3 IMPLANT
SUCT VACUUM KIWI BELL (SUCTIONS) ×3 IMPLANT
SUT CHROMIC 1 CTX 36 (SUTURE) ×9 IMPLANT
SUT PLAIN GUT 0 (SUTURE) ×6 IMPLANT
SUT VIC AB 0 CT1 36 (SUTURE) ×6 IMPLANT
SUT VIC AB 2-0 CT1 27 (SUTURE) ×2
SUT VIC AB 2-0 CT1 TAPERPNT 27 (SUTURE) ×4 IMPLANT
SUT VICRYL 3-0 27IN SH (SUTURE) ×3 IMPLANT
TRAXI PANNICULUS RETRACTOR (MISCELLANEOUS)

## 2019-08-12 NOTE — Lactation Note (Signed)
This note was copied from a baby's chart. Lactation Consultation Note  Patient Name: Toni Blake [id number] Date: 08/12/2019 Reason for consult: Initial assessment;Late-preterm 34-36.6wks;Infant < 6lbs;NICU baby  LC brought pump, pump kit, and cleaning supplies to [facility]. Mom is 2hrs post c/s delivery at 33wk4days.  Mom desires to breastfeed and/or provide expressed breastmilk to her baby in SCN.  [place] asked mom's desire for pumping at this time, mom tearful would like a little more time to heal/recover.  RNStanton Kidney updated with pump, kit, cleaning supplies available in room for when mom desires, educating both on ideal to initiate pumping/stimulation within the first 6 hour mark. Both mom and RN acknowledge and understand.  Maternal Data Formula Feeding for Exclusion: No  Feeding    LATCH Score                   Interventions    Lactation Tools Discussed/Used Pump Review: (Pump/kit left in room from RN set-up when mom is ready)   Consult Status Consult Status: Follow-up Date: 08/12/19 Follow-up type: In-patient    Toni Blake 08/12/2019, 4:44 PM

## 2019-08-12 NOTE — Brief Op Note (Signed)
08/12/2019  3:45 PM  PATIENT:  Toni Blake  29 y.o. female  PRE-OPERATIVE DIAGNOSIS: complete   placenta previa with bleeding   Elective sterilization  POST-OPERATIVE DIAGNOSIS: complete anterior  placenta previa with bleeding Elective sterilization   .PROCEDURE:  Primary LTCS  Bilateral salpingectomy  SURGEON:  Surgeon(s) and Role:    * Nithin Demeo, Gwen Her, MD - Primary  PHYSICIAN ASSISTANT: Casper Harrison , CNM   ASSISTANTS: none   ANESTHESIA:   spinal  EBL:  655 mL IOF 1000 cc  BLOOD ADMINISTERED:none  DRAINS: Urinary Catheter (Foley)   LOCAL MEDICATIONS USED:  BUPIVICAINE   SPECIMEN:  Source of Specimen:  distal porrtion of fimbrated fallopian tube, bilateral   DISPOSITION OF SPECIMEN:  PATHOLOGY  COUNTS:  YES  TOURNIQUET:  * No tourniquets in log *  DICTATION: .Other Dictation: Dictation Number verbal  PLAN OF CARE: Admit to inpatient   PATIENT DISPOSITION:  PACU - hemodynamically stable.   Delay start of Pharmacological VTE agent (>24hrs) due to surgical blood loss or risk of bleeding: not applicable

## 2019-08-12 NOTE — Anesthesia Preprocedure Evaluation (Signed)
Anesthesia Evaluation  Patient identified by MRN, date of birth, ID band Patient awake    Reviewed: Allergy & Precautions, NPO status , Patient's Chart, lab work & pertinent test results  History of Anesthesia Complications Negative for: history of anesthetic complications  Airway Mallampati: II  TM Distance: >3 FB Neck ROM: Full    Dental no notable dental hx.    Pulmonary neg sleep apnea, neg COPD, Current Smoker and Patient abstained from smoking.,    breath sounds clear to auscultation- rhonchi (-) wheezing      Cardiovascular Exercise Tolerance: Good (-) hypertension(-) CAD, (-) Past MI, (-) Cardiac Stents and (-) CABG  Rhythm:Regular Rate:Normal - Systolic murmurs and - Diastolic murmurs    Neuro/Psych  Headaches, neg Seizures PSYCHIATRIC DISORDERS Anxiety Depression    GI/Hepatic negative GI ROS, Neg liver ROS,   Endo/Other  negative endocrine ROS  Renal/GU negative Renal ROS     Musculoskeletal negative musculoskeletal ROS (+)   Abdominal (+) - obese,   Peds  Hematology  (+) anemia ,   Anesthesia Other Findings Past Medical History: No date: Anemia No date: Anxiety No date: Depression No date: Family history of adverse reaction to anesthesia No date: Migraine   Reproductive/Obstetrics (+) Pregnancy (placenta previa)                             Lab Results  Component Value Date   WBC 15.4 (H) 08/12/2019   HGB 9.9 (L) 08/12/2019   HCT 30.2 (L) 08/12/2019   MCV 88.8 08/12/2019   PLT 200 08/12/2019    Anesthesia Physical Anesthesia Plan  ASA: II  Anesthesia Plan: Spinal   Post-op Pain Management:    Induction:   PONV Risk Score and Plan: 1 and Ondansetron and Treatment may vary due to age or medical condition  Airway Management Planned: Natural Airway  Additional Equipment:   Intra-op Plan:   Post-operative Plan:   Informed Consent: I have reviewed the  patients History and Physical, chart, labs and discussed the procedure including the risks, benefits and alternatives for the proposed anesthesia with the patient or authorized representative who has indicated his/her understanding and acceptance.     Dental advisory given  Plan Discussed with: CRNA and Anesthesiologist  Anesthesia Plan Comments:         Anesthesia Quick Evaluation

## 2019-08-12 NOTE — Transfer of Care (Signed)
Immediate Anesthesia Transfer of Care Note  Patient: Toni Blake  Procedure(s) Performed: CESAREAN SECTION (N/A ) OPEN BILATERAL SALPINGECTOMY (Bilateral Uterus)  Patient Location: Endoscopy Unit  Anesthesia Type:Spinal  Level of Consciousness: awake, alert  and oriented  Airway & Oxygen Therapy: Patient Spontanous Breathing  Post-op Assessment: Report given to RN and Post -op Vital signs reviewed and stable  Post vital signs: Reviewed and stable  Last Vitals:  Vitals Value Taken Time  BP    Temp    Pulse    Resp    SpO2      Last Pain:  Vitals:   08/12/19 0736  TempSrc:   PainSc: 0-No pain         Complications: No apparent anesthesia complications

## 2019-08-12 NOTE — H&P (Signed)
Toni Blake is a 29 y.o. female presenting vaginal bleeding . Known anterior complete placenta previa . 2 episodes of bleeding in last 12 hours . This pregnancy resulted from a failed [place] BTL OB History    Gravida  5   Para  3   Term  3   Preterm      AB  1   Living  3     SAB  1   TAB      Ectopic      Multiple  0   Live Births  3          Past Medical History:  Diagnosis Date  . Anemia   . Anxiety   . Depression   . Family history of adverse reaction to anesthesia   . Migraine    Past Surgical History:  Procedure Laterality Date  . 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 LAPAROSCOPIC TUBAL LIGATION Bilateral 12/18/2018   Procedure: LAPAROSCOPIC TUBAL LIGATION BILATERAL;  Surgeon: Will Bonnet, MD;  Location: [facility] ORS;  Service: Gynecology;  Laterality: Bilateral;   Family History: family history includes Asthma in her father; Depression in her mother and sister; Diabetes in her paternal grandmother; Migraines in her mother. Social History:  reports that she has been smoking cigarettes. She has been smoking about 0.50 packs per day. She has never used smokeless tobacco. She reports that she does not drink alcohol or use drugs. Pregnancy issues  Factors complicating this pregnancy  1. Current smoker  1/2 PPD, cut down from 1PPD. Counseled to work towards quitting 03/24/2019 2. History of depression:  EPDS score 7 at new OB visit  Meds a long time ago, not recently 3. Conceived after BTL:  BTL performed by Dr. Glennon Mac at [facility] on 12/18/2018. Performed with clips. Salpingectomy at time of surgery for this delivery  4. Close interconceptual spacing  Last delivery 08/2018 5. Placenta previa with bleeding-  Low lying placenta at anatomy scan/ now Complete previa   07/10/2019: repeat at 28 weeks shows anterior placenta previa  Bleeding  07/09/2019 which resolved by 07/10/2019, bleeding with small clots  07/13/2019 which resolved by  07/14/2019  Betamethasone given 07/14/2019 [x]  and 07/15/2019 [ ]    Advised to be out of work for a few days, note written mah  Repeat bleed 07/29/2019- received 1 unit blood   [ ]  repeat at 36 weeks  Scheduled LTCS + bilateral salpingectomy 09/07/2019 by TJS .may proceed 34-37 weeks if bleeding episodes become more frequent  6. Anemia   07/10/2019: Hgb 9.6 - started on iron BID   [facility] at [facility] 07/29/2019 = 26%--> given 1 unit of blood  [ ]  repeat at 32 weeks  7. Elevated 1h OGTT  07/10/2019: 164  [x ] 3h OGTT: WNL    Maternal Diabetes: No Genetic Screening: Normal Maternal Ultrasounds/Referrals: Normal Fetal Ultrasounds or other Referrals:  None Maternal Substance Abuse:  Yes:  Type: Smoker Significant Maternal Medications:  None Significant Maternal Lab Results:  Other: unknown Other Comments:  None  Review of Systems History   Blood pressure (!) 94/58, pulse 65, temperature 98.5 F (36.9 C), temperature source Oral, resp. rate 11, height 5\' 5"  (1.651 m), weight 73.5 kg, last menstrual period 12/01/2018, SpO2 99 %, not currently breastfeeding. Exam Physical Exam  Prenatal labs: ABO, Rh: --/--/O POS (03/31 0158) Antibody: NEG (03/31 0158) Rubella:  Imm , varicella Imm RPR: Non Reactive (04/18 0843)  HBsAg:   neg  HIV:  neg GBS:   unknown   Assessment/Plan: Fifth bleeding episode with placenta previa . Previous steroid injections x 2 on 07/14/19 Rescue 12.5 Betamethasone given today   Proceed with LTCS and distal salpingectomy bilateral  Neonatology to speak with couple before surgery     Gwen Her [place] 08/12/2019, 4:14 PM

## 2019-08-12 NOTE — Progress Notes (Signed)
Patient ID: Toni Blake, female   DOB: [birth year], 29 y.o.   MRN: [id number]   Toni Blake is a 29 y.o. female. She is at [redacted]w[redacted]d gestation. Patient's last menstrual period was 12/01/2018 (exact date). Estimated Date of Delivery: 09/26/19  Prenatal care site: [facility] Chief complaint:bleeding episode at home . $th episode and know anterior placenta previa .passed clots size of quarters . No CTX , No LOF . No abd pain .come to hospital early this am and has been observed since . No additional bleeding this am  She is s/p Steroid dosing 07/14/19.      Maternal Medical History:   Past Medical History:  Diagnosis Date  . Anemia   . Anxiety   . Depression   . Family history of adverse reaction to anesthesia   . Migraine     Past Surgical History:  Procedure Laterality Date  . 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 LAPAROSCOPIC TUBAL LIGATION Bilateral 12/18/2018   Procedure: LAPAROSCOPIC TUBAL LIGATION BILATERAL;  Surgeon: Will Bonnet, MD;  Location: [facility] ORS;  Service: Gynecology;  Laterality: Bilateral;    Allergies  Allergen Reactions  . Vicodin [Hydrocodone-Acetaminophen] Rash    Prior to Admission medications   Medication Sig Start Date End Date Taking? Authorizing Provider  ferrous sulfate 325 (65 FE) MG EC tablet Take 1 tablet (325 mg total) by mouth 2 (two) times daily. 07/29/19 07/28/20 Yes Shelton Soler, Gwen Her, MD  Prenatal Vit-Fe Fumarate-FA (PRENATAL MULTIVITAMIN) TABS tablet Take 1 tablet by mouth daily at 12 noon.   Yes [provider]     Social History: She  reports that she has been smoking cigarettes. She has been smoking about 0.50 packs per day. She has never used smokeless tobacco. She reports that she does not drink alcohol or use drugs.  Family History: family history includes Asthma in her father; Depression in her mother and sister; Diabetes in her paternal grandmother; Migraines in her mother.  no history of  gyn cancers  Review of Systems: A full review of systems was performed and negative except as noted in the HPI.Review of Systems: A full review of systems was performed and negative except as noted in the HPI.   Eyes: no vision change  Ears: left ear pain  Oropharynx: no sore throat  Pulmonary . No shortness of breath , no hemoptysis Cardiovascular: no chest pain , no irregular heart beat  Gastrointestinal:no blood in stool . No diarrhea, no constipation Uro gynecologic: no dysuria , no pelvic pain, + vaginal bleeding  Neurologic : no seizure , no migraines    Musculoskeletal: no muscular weakness    O:  BP (!) 100/54   Pulse 69   Temp 98.5 F (36.9 C) (Oral)   Resp 18   Ht 5\' 5"  (1.651 m)   Wt 73.5 kg   LMP 12/01/2018 (Exact Date)   BMI 26.96 kg/m  Results for orders placed or performed during the hospital encounter of 08/12/19 (from the past 48 hour(s))  CBC with Differential/Platelet   Collection Time: 08/12/19  1:58 AM  Result Value Ref Range   WBC 15.4 (H) 4.0 - 10.5 K/uL   RBC 3.40 (L) 3.87 - 5.11 MIL/uL   Hemoglobin 9.9 (L) 12.0 - 15.0 g/dL   HCT 30.2 (L) 36.0 - 46.0 %   MCV 88.8 80.0 - 100.0 fL   MCH 29.1 26.0 - 34.0 pg   MCHC 32.8 30.0 - 36.0 g/dL   RDW  14.1 11.5 - 15.5 %   Platelets 200 150 - 400 K/uL   nRBC 0.0 0.0 - 0.2 %   Neutrophils Relative % 70 %   Neutro Abs 10.7 (H) 1.7 - 7.7 K/uL   Lymphocytes Relative 21 %   Lymphs Abs 3.3 0.7 - 4.0 K/uL   Monocytes Relative 6 %   Monocytes Absolute 1.0 0.1 - 1.0 K/uL   Eosinophils Relative 1 %   Eosinophils Absolute 0.2 0.0 - 0.5 K/uL   Basophils Relative 0 %   Basophils Absolute 0.1 0.0 - 0.1 K/uL   Immature Granulocytes 2 %   Abs Immature Granulocytes 0.27 (H) 0.00 - 0.07 K/uL  Sample to Blood Bank   Collection Time: 08/12/19  1:58 AM  Result Value Ref Range   Blood Bank Specimen SAMPLE AVAILABLE FOR TESTING    Sample Expiration      08/15/2019,2359 Performed at [facility], [address] [place], [place] [address]      Constitutional: NAD, AAOx3  HE/ENT: extraocular movements grossly intact, moist mucous membranes CV: RRR PULM: nl respiratory effort, CTABL     Abd: gravid, non-tender, non-distended, soft      Ext: Non-tender, Nonedmeatous   Psych: mood appropriate, speech normal Pelvic deferred  NST: REACTIVE Baseline: 120's Variability: moderate Accelerations present x >2 Decelerations absent Time 77mins  Results for orders placed or performed during the hospital encounter of 08/12/19 (from the past 24 hour(s))  CBC with Differential/Platelet     Status: Abnormal   Collection Time: 08/12/19  1:58 AM  Result Value Ref Range   WBC 15.4 (H) 4.0 - 10.5 K/uL   RBC 3.40 (L) 3.87 - 5.11 MIL/uL   Hemoglobin 9.9 (L) 12.0 - 15.0 g/dL   HCT 30.2 (L) 36.0 - 46.0 %   MCV 88.8 80.0 - 100.0 fL   MCH 29.1 26.0 - 34.0 pg   MCHC 32.8 30.0 - 36.0 g/dL   RDW 14.1 11.5 - 15.5 %   Platelets 200 150 - 400 K/uL   nRBC 0.0 0.0 - 0.2 %   Neutrophils Relative % 70 %   Neutro Abs 10.7 (H) 1.7 - 7.7 K/uL   Lymphocytes Relative 21 %   Lymphs Abs 3.3 0.7 - 4.0 K/uL   Monocytes Relative 6 %   Monocytes Absolute 1.0 0.1 - 1.0 K/uL   Eosinophils Relative 1 %   Eosinophils Absolute 0.2 0.0 - 0.5 K/uL   Basophils Relative 0 %   Basophils Absolute 0.1 0.0 - 0.1 K/uL   Immature Granulocytes 2 %   Abs Immature Granulocytes 0.27 (H) 0.00 - 0.07 K/uL  Sample to Blood Bank     Status: None   Collection Time: 08/12/19  1:58 AM  Result Value Ref Range   Blood Bank Specimen SAMPLE AVAILABLE FOR TESTING    Sample Expiration      08/15/2019,2359 Performed at [facility], [address] [place], [place] [address]     Assessment: 29 y.o. [redacted]w[redacted]d here for bleeding episode   Principle diagnosis: Anterior Placenta previa with intermittent bleeding episodes .Clinically stable this am . Concerned about the continued episodes that are random and spontaneous .  Reassuring fetal  monitoring currently  Plan:  Labor: not present.   Fetal Wellbeing: Reassuring Cat 1 tracing.  Reactive NST  I have discussed this am with the pt the risk of trying to make the 37 week mark vs proceeding to scheduled c/s earlier than that . Risk of significant bleed  compromising both baby and mother is unpredictable . AFter discussion The pt and I have arrived at the decision to more c/s date up to 35 weeks . Rescue steroids given the 34th week. If additional bleeding episode between 34-35 th week I recommend proceeding to surgery .   ----- Huel Cote MD Attending Obstetrician and Gynecologist [facility], Department of [facility]

## 2019-08-12 NOTE — Progress Notes (Signed)
Dr schermerhorn at bedside discussing plan of care to proceed with C Section today due to bleeding. Pt tearful but states understanding. BMZ given and neonatology notified. MD talked with OR and MDA and plan to go to OR at 1430.

## 2019-08-12 NOTE — OB Triage Note (Signed)
Patient came in for observation for vaginal bleeding that started 2300 last night. Patient has a history of complete previa. Patient denies uterine contractions. Patient denies of leaking of fluid and reports + FM. Vital signs stable and patient afebrile. FHR baseline 130 with moderate variability with accelerations 15 x 15 and no decelerations. Patient in her room at bedside. Monitors applied.

## 2019-08-12 NOTE — Anesthesia Procedure Notes (Signed)
Spinal  Patient location during procedure: OR Start time: 08/12/2019 2:31 PM End time: 08/12/2019 2:33 PM Staffing Performed: resident/CRNA  Anesthesiologist: Emmie Niemann, MD Resident/CRNA: Caryl Asp, CRNA Preanesthetic Checklist Completed: patient identified, IV checked, site marked, risks and benefits discussed, surgical consent, monitors and equipment checked, pre-op evaluation and timeout performed Spinal Block Patient position: sitting Prep: ChloraPrep Patient monitoring: heart rate, continuous pulse ox and blood pressure Approach: midline Location: L4-5 Injection technique: single-shot Needle Needle type: Introducer and Pencil-Tip  Needle gauge: 24 G Needle length: 9 cm Additional Notes Negative paresthesia. Negative blood return. Positive free-flowing CSF. Expiration date of kit checked and confirmed. Patient tolerated procedure well, without complications.

## 2019-08-12 NOTE — Discharge Summary (Signed)
Obstetrical Discharge Summary  Patient Name: Toni Blake DOB: 1990-10-31 MRN: [id number]  Date of Admission: 08/12/2019 Date of Delivery: 08/12/2019 Delivered by: Huel Cote MD Date of Discharge: 08/14/2019 Primary OB: [place] [id number] last menstrual period was 12/01/2018 (exact date). EDC Estimated Date of Delivery: 09/26/19 Gestational Age at Delivery: [redacted]w[redacted]d   Antepartum complications:bleeding placenta previa  Admitting Diagnosis: same  Secondary Diagnosis: Patient Active Problem List   Diagnosis Date Noted  . Placenta previa 08/12/2019  . Vaginal bleeding in pregnant patient after first trimester with placenta previa 08/12/2019  . Placenta previa affecting delivery 08/12/2019  . Postoperative state 08/12/2019  . Vaginal bleeding in pregnancy, third trimester 07/14/2019  . Consultation for sterilization 12/18/2018  . Postpartum care following vaginal delivery 08/30/2018  . Indication for care in labor and delivery, antepartum 07/21/2018  . Panic disorder 01/07/2016  . Tobacco use disorder 01/07/2016  . Adjustment disorder with depressed mood 01/06/2016  . Suicidal ideation 01/06/2016  . Migraine 01/06/2016  . Cocaine abuse ([place]) 01/06/2016    Augmentation:  Complications:  Intrapartum complications/course:  Date of Delivery: 08/12/19 Delivered By: Huel Cote MD Delivery Type: primary cesarean section, low transverse incision Anesthesia:spinal Placenta: manual Laceration: none Episiotomy: none Newborn Data: Live born female  Birth Weight: 3 lb 13.7 oz (1750 g) APGAR: 9, 10  Newborn Delivery   Birth date/time: 08/12/2019 14:53:00 Delivery type: C-Section, Vacuum Assisted Trial of labor: No C-section categorization: Primary     Postpartum Procedures: none   Postpartum course:  Patient had an uncomplicated postpartum course.  By time of discharge on POD#2, her pain was controlled on oral pain medications; she had appropriate  lochia and was ambulating, voiding without difficulty, tolerating regular diet and passing flatus.   She was deemed stable for discharge to home.    Discharge Physical Exam:  BP (!) 98/53 (BP Location: Right Arm)   Pulse 78   Temp 98.1 F (36.7 C) (Oral)   Resp 18   Ht 5\' 5"  (1.651 m)   Wt 73.5 kg   LMP 12/01/2018 (Exact Date)   SpO2 96%   BMI 26.96 kg/m   General: NAD CV: RRR Pulm: CTABL, nl effort ABD: s/nd/nt, fundus firm and below the umbilicus Lochia: moderate Incision: c/d/i DVT Evaluation: LE non-ttp, no evidence of DVT on exam.  Hemoglobin  Date Value Ref Range Status  08/14/2019 8.6 (L) 12.0 - 15.0 g/dL Final  06/12/2018 10.1 (L) 11.1 - 15.9 g/dL Final   HCT  Date Value Ref Range Status  08/14/2019 26.5 (L) 36.0 - 46.0 % Final   Hematocrit  Date Value Ref Range Status  06/12/2018 29.6 (L) 34.0 - 46.6 % Final    Disposition: stable, discharge to home. Baby Feeding: expressed breastmilk  Baby Disposition: SCN d/t prematurity   Rh Immune globulin given: Rh pos Rubella vaccine given: Immune  Tdap vaccine given in AP or PP setting: 07/10/2019 Flu vaccine given in AP or PP setting: 03/23/2020  Contraception: distal salpingectomy , bilateral ( previous failed Filshie clips)  Prenatal Labs:  ABO, Rh:O POS (03/31 0158) Antibody: NEG (03/31 0158) Rubella:  Imm , varicella Imm RPR: Non Reactive (04/18 0843)  HBsAg:   neg  HIV:   neg GBS:   unknown   Plan:  Toni Blake was discharged to home in good condition. Follow-up appointment with delivering provider in 2 weeks for incision check.  Discharge Medications: Allergies as of 08/14/2019      Reactions   Vicodin [hydrocodone-acetaminophen] Rash  Medication List    TAKE these medications   acetaminophen 325 MG tablet Commonly known as: TYLENOL Take 2 tablets (650 mg total) by mouth every 6 (six) hours as needed for mild pain or moderate pain.   coconut oil Oil Apply 1 application topically  as needed.   ferrous sulfate 325 (65 FE) MG EC tablet Take 1 tablet (325 mg total) by mouth 2 (two) times daily.   ibuprofen 600 MG tablet Commonly known as: ADVIL Take 1 tablet (600 mg total) by mouth every 6 (six) hours as needed for mild pain or moderate pain.   oxyCODONE 5 MG immediate release tablet Commonly known as: Oxy IR/ROXICODONE Take 1 tablet (5 mg total) by mouth every 4 (four) hours as needed for up to 7 days for moderate pain.   prenatal multivitamin Tabs tablet Take 1 tablet by mouth daily at 12 noon.   senna-docusate 8.6-50 MG tablet Commonly known as: Senokot-S Take 2 tablets by mouth daily.       Follow-up Information    Schermerhorn, Gwen Her, MD. Schedule an appointment as soon as possible for a visit in 2 day(s).   Specialty: Obstetrics and Gynecology Why: Please call to schedule a 2 week postpartum follow up appointment with Dr. Ouida Sills for an incison check Contact information: [address] [place] [address] [phone]           Signed:  Drinda Butts, CNM Certified Nurse Midwife [facility]

## 2019-08-13 LAB — BPAM RBC
Blood Product Expiration Date: 202104152359
Blood Product Expiration Date: 202104152359
ISSUE DATE / TIME: 202103301448
ISSUE DATE / TIME: 202103301448
Unit Type and Rh: 5100
Unit Type and Rh: 5100

## 2019-08-13 LAB — CBC
HCT: 26.7 % — ABNORMAL LOW (ref 36.0–46.0)
Hemoglobin: 8.8 g/dL — ABNORMAL LOW (ref 12.0–15.0)
MCH: 29.2 pg (ref 26.0–34.0)
MCHC: 33 g/dL (ref 30.0–36.0)
MCV: 88.7 fL (ref 80.0–100.0)
Platelets: 190 10*3/uL (ref 150–400)
RBC: 3.01 MIL/uL — ABNORMAL LOW (ref 3.87–5.11)
RDW: 13.9 % (ref 11.5–15.5)
WBC: 23.9 10*3/uL — ABNORMAL HIGH (ref 4.0–10.5)
nRBC: 0 % (ref 0.0–0.2)

## 2019-08-13 LAB — TYPE AND SCREEN
ABO/RH(D): O POS
Antibody Screen: NEGATIVE
Unit division: 0
Unit division: 0

## 2019-08-13 LAB — PREPARE RBC (CROSSMATCH)

## 2019-08-13 LAB — SARS CORONAVIRUS 2 (TAT 6-24 HRS): SARS Coronavirus 2: NEGATIVE

## 2019-08-13 MED ORDER — IBUPROFEN 600 MG PO TABS
600.0000 mg | ORAL_TABLET | Freq: Four times a day (QID) | ORAL | Status: DC
  Administered 2019-08-13 – 2019-08-14 (×4): 600 mg via ORAL
  Filled 2019-08-13 (×4): qty 1

## 2019-08-13 MED ORDER — ACETAMINOPHEN 325 MG PO TABS
650.0000 mg | ORAL_TABLET | Freq: Four times a day (QID) | ORAL | Status: DC
  Administered 2019-08-13 – 2019-08-14 (×3): 650 mg via ORAL
  Filled 2019-08-13 (×3): qty 2

## 2019-08-13 NOTE — Op Note (Signed)
NAME: Toni Blake, Toni Blake MEDICAL RECORD [id number] ACCOUNT [id number] DATE OF BIRTH:Sep 21, 1990 FACILITY: [facility] LOCATION: [facility] PHYSICIAN:Lavere Stork Josefine Class, MD  OPERATIVE REPORT  DATE OF PROCEDURE:  08/12/2019  PREOPERATIVE DIAGNOSES: 1.  33+4 weeks estimated gestational age. 2.  Complete anterior placenta previa with bleeding. 3.  Elective permanent sterilization.  POSTOPERATIVE DIAGNOSES: 1.  33+4 weeks estimated gestational age. 2.  Complete anterior placenta previa with bleeding. 3.  Elective sterilization.  PROCEDURES:   1.  Primary low transverse cesarean section. 2.  Distal salpingectomy bilaterally.  ANESTHESIA:  Spinal.  SURGEON:  Laverta Baltimore, MD  FIRST ASSISTANT:  Linda Hedges, CNM  INDICATIONS:  A 29 year old gravida 85, para 3 patient at 33+4 weeks who has been followed for multiple admissions for bleeding of her complete placenta previa.  The patient presented the early morning of the day of surgery, 08/12/2019 with an episode of  bleeding the previous night.  The patient was observed on labor and delivery, stabilized and then 12 hours later, had another 80-100 mL bleeding event.  The patient was counseled regarding the risk of significant bleeding with a placenta previa and  decision was made to take the patient to the operating room to perform a cesarean section.  The patient agrees and understands the risk and the issues at hand.  Neonatology had a chance to talk with the patient regarding infant stay and the nursery.  DESCRIPTION OF PROCEDURE:  After adequate spinal anesthesia, the patient was placed in dorsal supine position.  A hip roll was placed under the right side.  The patient was prepped and draped in normal sterile fashion.  The patient did receive 2 g of IV  Ancef prior to commencement of the case.  Timeout was performed.  A Pfannenstiel incision was made 2 fingerbreadths above the symphysis pubis.  Sharp dissection was used to  identify the fascia, which was opened in the midline bilaterally.  Kocher clamps  were used to elevate the fascia and the recti muscles were dissected free.  Inferior aspect of the fascia was grasped with Kocher clamps.  Pyramidalis muscle was dissected free.  The peritoneal cavity was grasped and opened sharply.  The vesicouterine  peritoneal fold was identified, was opened and the bladder was reduced inferiorly with aid of the bladder blade.  Low transverse uterine incision was made.  Upon entry into the endometrial cavity, bleeding was encountered given the anterior placenta.   Ultimately, the amniotic cavity was entered and the fetal head was brought to the incision.  Kiwi vacuum was applied to the infant's occiput and with 1 gentle pull, the head was delivered and the vacuum was removed.  Shoulders and body were delivered  without difficulty.  Vigorous female.  Cord was doubly clamped and infant was passed to neonatology staff who assigned Apgar scores of 9 and 10.  Time of birth was 1453 hours on 08/12/2019.  Fetal weight 1750 g, female.  The placental unit, which was  partially dissected to get to the fetus, was then removed and the uterus was exteriorized.  The endometrial cavity was wiped clean with laparotomy tape and the cervix was opened with a sponge stick.  Uterine incision was closed with a running locking #1  chromic suture with good approximation of edges.  Two additional figure-of-eight sutures were required for hemostasis.  Posterior cul-de-sac was irrigated and suctioned.  Fallopian tubes were identified and Filshie clips were on both the midportion of  the fallopian tubes.  Right _clip___ may have not covered  the complete fallopian tube.  Attention was directed to the patient's left fallopian tube and a large Kelly clamp was placed at the midportion of the fallopian tube through the mesosalpinx to  incorporate total removal of the distal fimbria.  A 2-0 Vicryl suture was used to tie the  pedicle.  Good hemostasis was noted.  Similar procedure was repeated on the patient's right side.  Again from the Filshie clip distal to incorporate the fimbriated  end, a clamp was placed and the fimbriated end was removed and the pedicle was sutured with 2-0 Vicryl suture.  Good hemostasis was noted.  Uterine incision appeared hemostatic.  Uterus was placed back into the abdominal cavity and the pericolic gutters  were wiped clean with laparotomy tape and again the salpingectomy sites appeared hemostatic bilaterally.  Interceed was placed over the uterine incision in a T-shaped fashion.  Fascia was then closed with 0 Vicryl suture in a running nonlocking fashion.   Good approximation of the edges.  Good hemostasis was noted.  The fascial edges were then injected with a solution of 20 mL of 1.3% Exparel plus 30 mL of 0.5% Marcaine plus 50 mL normal saline.  Fifty-five mL of this solution was injected subfascially.   Subcutaneous tissues were irrigated and bovied for hemostasis and the skin was reapproximated with Insorb absorbable staples and additional 30 mL of Exparel solution was injected beneath the skin.  The patient tolerated the procedure well.  ESTIMATED BLOOD LOSS:  655 mL, measured.  INTRAOPERATIVE FLUIDS:  1000 mL.  DISPOSITION:  The patient was taken to the recovery room in good condition.  VN/NUANCE  D:08/12/2019 T:08/12/2019 JOB:[id number]

## 2019-08-13 NOTE — Anesthesia Post-op Follow-up Note (Signed)
  Anesthesia Pain Follow-up Note  Patient: Toni Blake  Day #: 1  Date of Follow-up: 08/13/2019 Time: 12:54 PM  Last Vitals:  Vitals:   08/13/19 0900 08/13/19 1100  BP:    Pulse:    Resp:    Temp:    SpO2: 100% 98%    Level of Consciousness: alert  Pain: none   Side Effects:None  Catheter Site Exam:clean, dry, no drainage  Anti-Coag Meds (From admission, onward)   Start     Dose/Rate Route Frequency Ordered Stop   08/13/19 1300  enoxaparin (LOVENOX) injection 40 mg     40 mg Subcutaneous Every 24 hours 08/12/19 1909         Plan: D/C from anesthesia care at surgeon's request  Hedda Slade

## 2019-08-13 NOTE — Progress Notes (Signed)
Post Partum Day 1 Subjective: Doing well, no complaints.  Tolerating regular diet, pain with PO meds, voiding and ambulating without difficulty. Pumping for infant in SCN  No CP SOB Fever,Chills, N/V or leg pain; denies nipple or breast pain, no HA change of vision, RUQ/epigastric pain  Objective: BP 121/61 (BP Location: Left Arm)   Pulse 83   Temp 98.4 F (36.9 C)   Resp 18   Ht 5\' 5"  (1.651 m)   Wt 73.5 kg   LMP 12/01/2018 (Exact Date)   SpO2 100%   BMI 26.96 kg/m    Physical Exam:  General: NAD Breasts: soft/nontender CV: RRR Pulm: nl effort, CTABL Abdomen: soft, NT, BS x 4 Incision: Dsg CDI/- no drainage noted Lochia: scant Uterine Fundus: fundus firm and 2 fb below umbilicus DVT Evaluation: no cords, ttp LEs   Recent Labs    08/12/19 0158 08/13/19 0551  HGB 9.9* 8.8*  HCT 30.2* 26.7*  WBC 15.4* 23.9*  PLT 200 190    Assessment/Plan: 29 y.o. [id number] postpartum day # 1  - Continue routine PP care - Lactation consult prn - bilateral distal salpingectomy done with CS.  - Acute blood loss anemia - hemodynamically stable and asymptomatic; start po ferrous sulfate BID with stool softeners - elevated WBCs- afebrile. Plan repeat CBC in am.   - Immunization status: all Imms up to date, flu declined prenatally. Tdap given 07/10/19    Disposition: Does not desire Dc home today.     Francetta Found, CNM 08/13/2019  11:22 AM

## 2019-08-13 NOTE — Lactation Note (Signed)
Lactation Consultation Note  Patient Name: Toni Blake [id number] Date: 08/13/2019 Reason for consult: Initial assessment;NICU baby;Preterm <34wks;Infant < 6lbs  LC entered room for initial assessment. Mom is G5P3. Baby born at [redacted]w[redacted]d via C/S in [facility] receiving care. Mom shares that baby is "doing better" today.   Mom would like to provide breastmilk for baby girl "Toni Blake". Was started pumping yesterday. Denies any pain or discomfort with pumping. Shares that she missed pumping overnight due to needing rest and isn't seeing anything in pump right now.   Encouraged mother to continue pumping Q2-3 hrs while separated from newborn. Reassured her that it is normal to not see any milk from the pump at this point. Discussed the importance of frequent pumping along with hand expressing in order to protect her supply.   Last pumped at 2pm today. Encouraged to attempt pumping on 8,11,2,5 schedule to remember when to pump moving forward. Hand expression taught and demonstrated by [facility] student while bedside. Colostrum easily expressible.   Encouraged mother to reach out to lactation support should any questions or concerns arise. Mother verbalizes understanding of plan.   Maternal Data Formula Feeding for Exclusion: No Has patient been taught Hand Expression?: Yes  Feeding    LATCH Score                   Interventions Interventions: Breast feeding basics reviewed;Breast massage;Hand express;Expressed milk;Coconut oil;DEBP  Lactation Tools Discussed/Used Pump Review: Setup, frequency, and cleaning;Milk Storage   Consult Status Consult Status: Follow-up Date: 08/13/19 Follow-up type: In-patient    [place] student 08/13/2019, 2:48 PM

## 2019-08-13 NOTE — Anesthesia Postprocedure Evaluation (Signed)
Anesthesia Post Note  Patient: CHELISE DRAIN  Procedure(s) Performed: CESAREAN SECTION (N/A ) OPEN BILATERAL SALPINGECTOMY (Bilateral )  Patient location during evaluation: Mother Baby Anesthesia Type: Spinal Level of consciousness: oriented and awake and alert Pain management: pain level controlled Vital Signs Assessment: post-procedure vital signs reviewed and stable Respiratory status: spontaneous breathing and respiratory function stable Cardiovascular status: blood pressure returned to baseline and stable Postop Assessment: no headache, no backache, no apparent nausea or vomiting and able to ambulate Anesthetic complications: no     Last Vitals:  Vitals:   08/13/19 0900 08/13/19 1100  BP:    Pulse:    Resp:    Temp:    SpO2: 100% 98%    Last Pain:  Vitals:   08/13/19 1156  TempSrc:   PainSc: 6                  Camyla Camposano

## 2019-08-13 NOTE — Consult Note (Signed)
Neonatology consultation (late entry - patient seen 08/12/19 about 1pm)  Asked by Dr.Schermerhorn to provide prenatal consultation for 29 y.o. G5 P3 who is now 33.[redacted] weeks EGA, with pregnancy complicated by complete placenta previa and recurrent bleeding.  She was treated with betamethasone earlier this month and has had a "rescue dose" today.  Discussed usual expectations for preterm infant at 33 - [redacted] weeks gestation, including possible needs for DR resuscitation, respiratory support, and IV access.  Projected possible length of stay in NICU until 37 - [redacted] wks EGA.  Met with patient and FOB, discussed advantages of feeding with mother's milk.  She plans to pump postnatally.  Patient and FOB were attentive, had appropriate questions, and expressed appreciation of my input.  Thank you for consulting Neonatology.  Total time 20 minutes  JWimmer, MD

## 2019-08-13 NOTE — Lactation Note (Signed)
This note was copied from a baby's chart. Lactation Consultation Note  Patient Name: Toni Blake [id number] Date: 08/13/2019     Maternal Data    Feeding Feeding Type: Breast Milk  LATCH Score                   Interventions    Lactation Tools Discussed/Used  DEBP: Mom has her own DEBP for home use- Spectra, has not opened it yet. Encouraged to seek support with use if needed. Provided reassurance that DEBP would be available bedside when visiting [name], and lactation support available when/if needed. [place] referral for DEBP was sent as well.   Consult Status      Toni Blake 08/13/2019, 4:05 PM

## 2019-08-14 LAB — CBC
HCT: 26.5 % — ABNORMAL LOW (ref 36.0–46.0)
Hemoglobin: 8.6 g/dL — ABNORMAL LOW (ref 12.0–15.0)
MCH: 29.7 pg (ref 26.0–34.0)
MCHC: 32.5 g/dL (ref 30.0–36.0)
MCV: 91.4 fL (ref 80.0–100.0)
Platelets: 196 10*3/uL (ref 150–400)
RBC: 2.9 MIL/uL — ABNORMAL LOW (ref 3.87–5.11)
RDW: 14.5 % (ref 11.5–15.5)
WBC: 15.7 10*3/uL — ABNORMAL HIGH (ref 4.0–10.5)
nRBC: 0 % (ref 0.0–0.2)

## 2019-08-14 LAB — SURGICAL PATHOLOGY

## 2019-08-14 MED ORDER — COCONUT OIL OIL: 1.0000 "application " | TOPICAL_OIL | 0 refills | Status: AC | PRN

## 2019-08-14 MED ORDER — OXYCODONE HCL 5 MG PO TABS: 5.0000 mg | ORAL_TABLET | ORAL | 0 refills | Status: AC | PRN

## 2019-08-14 MED ORDER — ACETAMINOPHEN 325 MG PO TABS: 650.0000 mg | ORAL_TABLET | Freq: Four times a day (QID) | ORAL | Status: AC | PRN

## 2019-08-14 MED ORDER — IBUPROFEN 600 MG PO TABS: 600.0000 mg | ORAL_TABLET | Freq: Four times a day (QID) | ORAL | 3 refills | Status: AC | PRN

## 2019-08-14 MED ORDER — SENNOSIDES-DOCUSATE SODIUM 8.6-50 MG PO TABS: 2.0000 | ORAL_TABLET | ORAL | Status: AC

## 2019-08-14 NOTE — Progress Notes (Signed)
Discharge order received from doctor. Incision cleaning kit given and reviewed. Reviewed discharge instructions and prescriptions with patient and answered all questions. Follow up appointment instructions given (office closed; unable to make follow up appointment). Patient verbalized understanding. Patient discharged home (without infant; infant in SCN) via wheelchair by nursing/auxillary.    Hilbert Bible, RN

## 2019-08-14 NOTE — Discharge Instructions (Signed)
Please call your doctor or return to the ER if you experience any chest pains, shortness of breath, dizziness, visual changes, severe headache (unrelieved by pain meds), fever greater than 101, any heavy bleeding (saturating more than 1 pad per hour), large clots, or foul smelling discharge, any worsening abdominal pain and cramping that is not controlled by pain medication, any calf/leg pain or redness, any breast concerns (redness/pain), or any signs of postpartum depression. No tampons, enemas, douches, or sexual intercourse for 6 weeks. Also avoid tub baths, hot tubs, or swimming for 6 weeks.    Check your incision daily for any signs of infection such as redness, warmth, swelling, increased pain, our pus/foul smelling drainage  Activity: do not lift over 10 lbs for 6 weeks  No driving for 1-2 weeks  Pelvic rest for 6 weeks

## 2019-08-16 ENCOUNTER — Ambulatory Visit: Payer: Self-pay

## 2019-08-16 NOTE — Lactation Note (Addendum)
This note was copied from a baby's chart. Lactation Consultation Note  Patient Name: Toni Blake [id number] Date: 08/16/2019   Mom in to visit tonight and have lick and learn session.  Mom usually does not come in until after 7 pm.  Mom may be able to put [name] to the breast without having to pump before to see if she can handle the flow tomorrow.  Mom is bringing in large volumes of mature milk.  Mom has red blood blisters on both nipples.  She thinks it is from her Spectra pump.  Encouraged mom to keep on lowest setting, to use coconut oil on flanges and order larger flange size.  She reports not having this problem when she uses the Symphony.  A referral has been faxed to [facility] for mom to get Symphony pump.  Mom plans to go as soon as they open back up after Easter holiday.  Symphony set up at bedside to pre pump before lick and learn session.  Mom denies any pain with pumping and no blisters noted while pumping.  Lick and learn session went well with [name] latching and taking a few sucks.  Mom is going to leave DEBP kit at bedside because she has a kit at home to use when she gets DEBP through [facility].  Encouraged mom to call with any questions, concerns or assistance.      Maternal Data    Feeding Feeding Type: Breast Milk  LATCH Score                   Interventions    Lactation Tools Discussed/Used     Consult Status      Jarold Motto 08/16/2019, 8:07 PM

## 2019-08-31 ENCOUNTER — Other Ambulatory Visit: Payer: Medicaid Other

## 2019-08-31 ENCOUNTER — Ambulatory Visit: Payer: Self-pay

## 2019-08-31 NOTE — Lactation Note (Signed)
This note was copied from a baby's chart. Lactation Consultation Note  Patient Name: Toni Blake [id number] Date: 08/31/2019   Mom usually does not come in to visit Toni Blake until after 8 pm.  Mom is now on 14 day quarantine due to exposure to covid.  Mom has milk in the freezer, but says she can get someone to bring some more in if we start running low.  Mom was putting her to the breast when she would visit.  Toni Blake is getting all breast milk via bottle or tube/gavage now.    Maternal Data    Feeding Feeding Type: Breast Milk Nipple Type: Extra Slow Flow  LATCH Score                   Interventions    Lactation Tools Discussed/Used     Consult Status      Jarold Motto 08/31/2019, 8:10 PM

## 2019-09-03 ENCOUNTER — Other Ambulatory Visit: Admission: RE | Admit: 2019-09-03 | Payer: Medicaid Other | Source: Ambulatory Visit

## 2019-09-06 ENCOUNTER — Ambulatory Visit: Payer: Self-pay

## 2019-09-06 NOTE — Lactation Note (Signed)
This note was copied from a baby's chart. Lactation Consultation Note  Patient Name: Toni Blake [id number] Date: 09/06/2019 Reason for consult: Follow-up assessment;Mother's request;Preterm <34wks;Infant < 6lbs;Other (Comment)(This is mom's first day back after 14 day Covid quarantine)  Mom has been quarantined for 14 days d/t Covid exposure.  The is mom's first day back with baby.  Initially [name] was sleepy.  Put her next to mom, hand expressed a few drops in her mouth and then she started opening her mouth and latched.  She sucked vigorously with strong, rhythmic sucking that mom could feel with frequent swallows sustaining the latch for 10 minutes before falling back to sleep.  The last 5 minutes was more flutter sucking before she refused to suck any more.  Even in quarantine, mom sent lots of breast milk.  Mom breast fed her other 3 children.  Her first she only breast fed for 6 weeks, her second for 4 months and her 27 year old for 2 months.  She said she would have breast fed longer, but when she went back to work she had no where to pump.  Praised mom for breast feeding when she is here and continuing to supply breast milk by pumping with her Spectra pump when separated.  Mom reports really sore nipples when she first started using the Spectra pump noticing some blistering on her nipples that got better after she started using the coconut oil.  Encouraged mom to call for lactation assistance or if she had any questions or concerns. Maternal Data Formula Feeding for Exclusion: No Has patient been taught Hand Expression?: Yes Does the patient have breastfeeding experience prior to this delivery?: Yes  Feeding Feeding Type: Breast Fed Nipple Type: Extra Slow Flow  LATCH Score Latch: Repeated attempts needed to sustain latch, nipple held in mouth throughout feeding, stimulation needed to elicit sucking reflex.  Audible Swallowing: Spontaneous and intermittent  Type of Nipple: Everted  at rest and after stimulation  Comfort (Breast/Nipple): Filling, red/small blisters or bruises, mild/mod discomfort  Hold (Positioning): Assistance needed to correctly position infant at breast and maintain latch.  LATCH Score: 7  Interventions Interventions: Breast feeding basics reviewed;Assisted with latch;Skin to skin;Breast massage;Hand express;Breast compression;Adjust position;Support pillows;Position options;Coconut oil  Lactation Tools Discussed/Used WIC Program: Yes Pump Review: Setup, frequency, and cleaning;Milk Storage;Other (comment) Date initiated:: 08/12/19   Consult Status Consult Status: PRN Follow-up type: Call as needed    Jarold Motto 09/06/2019, 1:49 PM

## 2019-09-07 ENCOUNTER — Inpatient Hospital Stay: Admit: 2019-09-07 | Payer: Medicaid Other | Admitting: Obstetrics and Gynecology

## 2019-09-07 SURGERY — Surgical Case
Anesthesia: Spinal | Laterality: Bilateral

## 2020-05-27 IMAGING — US US OB COMP +14 WK
1 series · 14 of 28 positions shown · non-contrast
Comparison: none

CLINICAL DATA: Vaginal bleeding

EXAM:
LIMITED OBSTETRIC ULTRASOUND

[Series 1001: us ob comp +14 wk · 0.26mm/px · 14 of 107 slices shown]
[im 4/107]
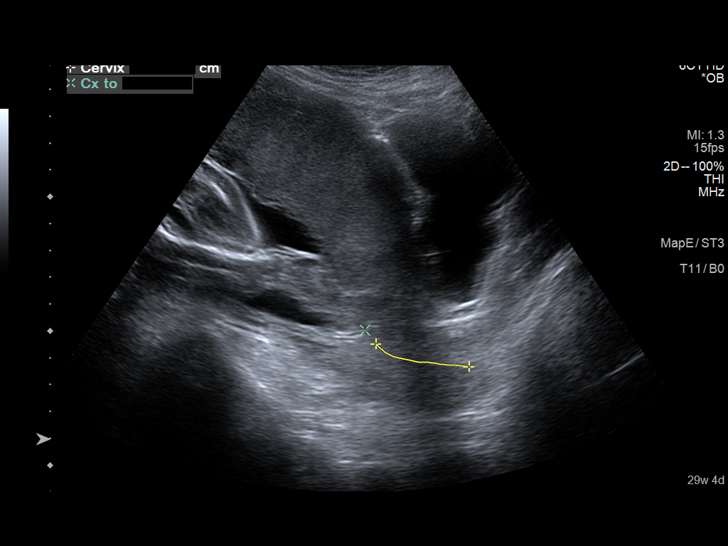
[im 12/107]
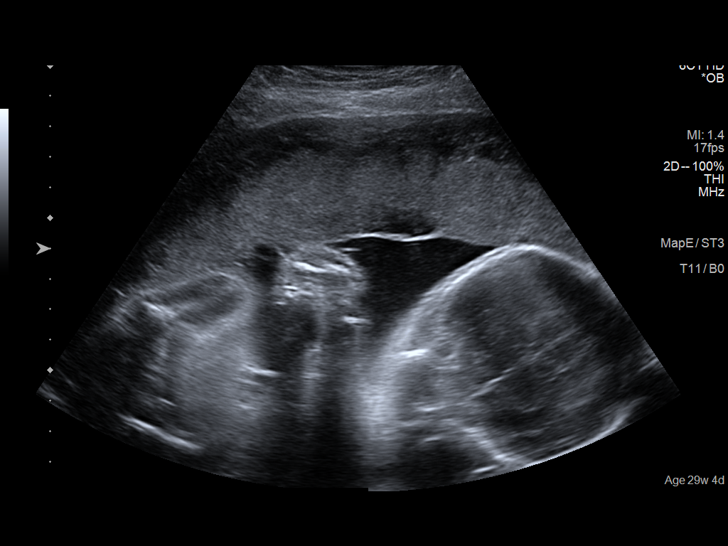
[im 20/107]
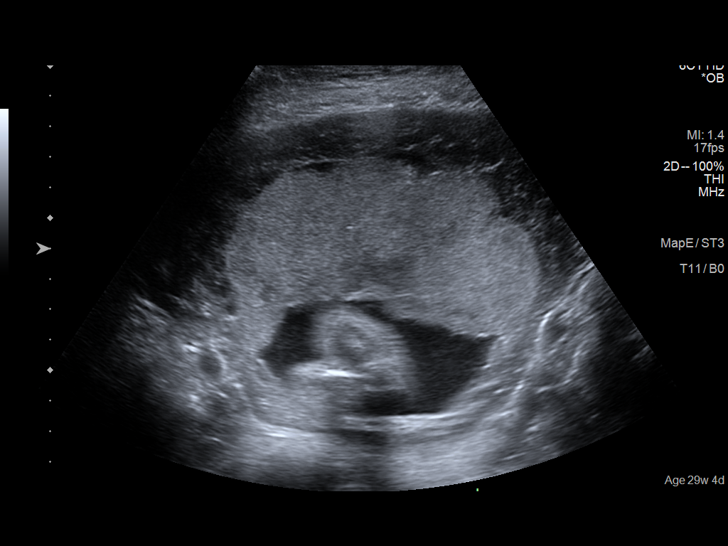
[im 28/107]
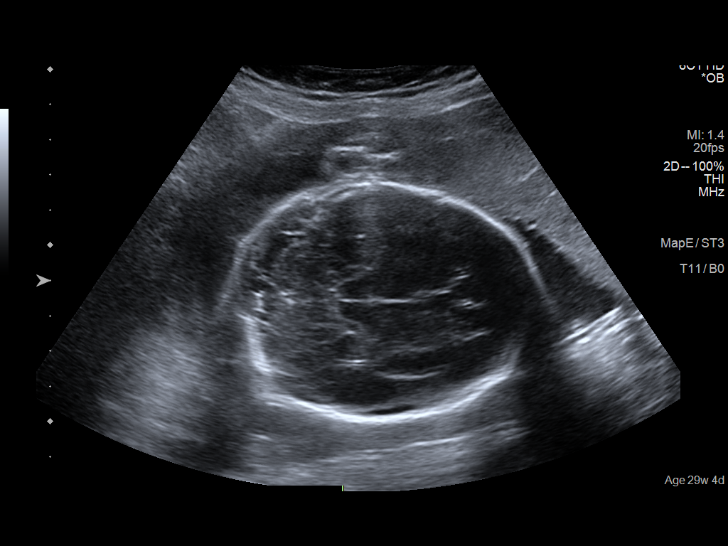
[im 36/107]
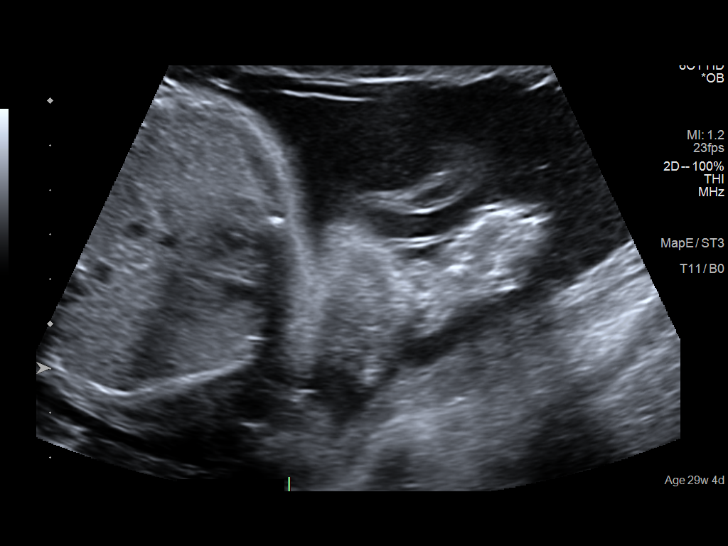
[im 44/107]
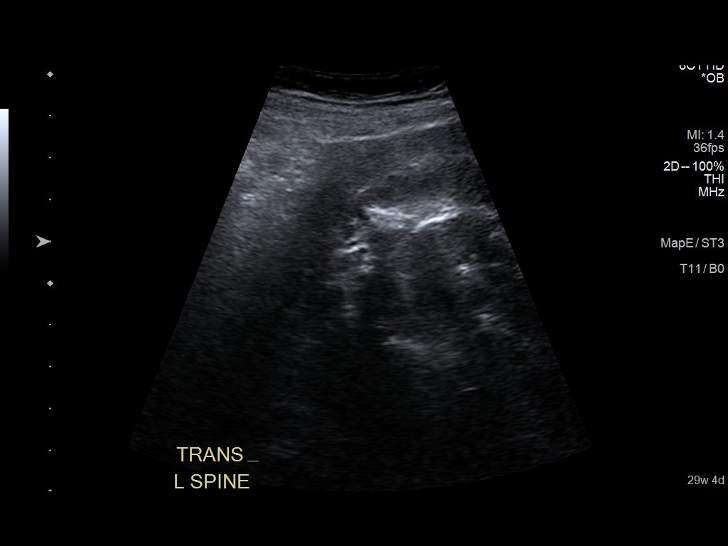
[im 52/107]
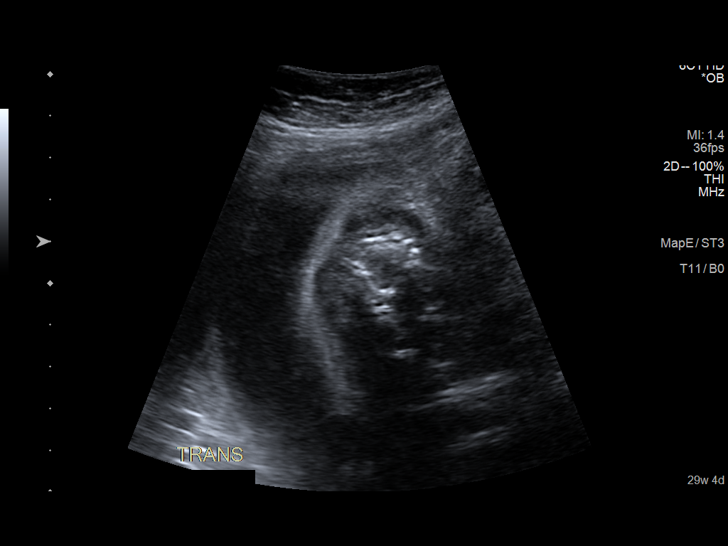
[im 59/107]
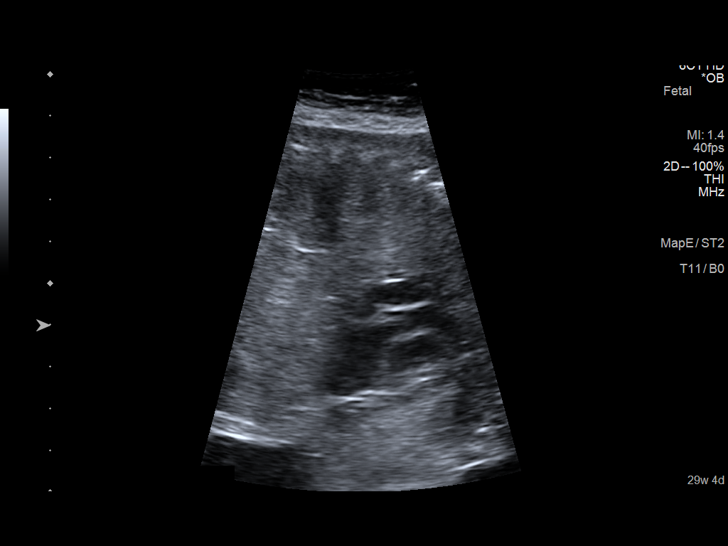
[im 67/107]
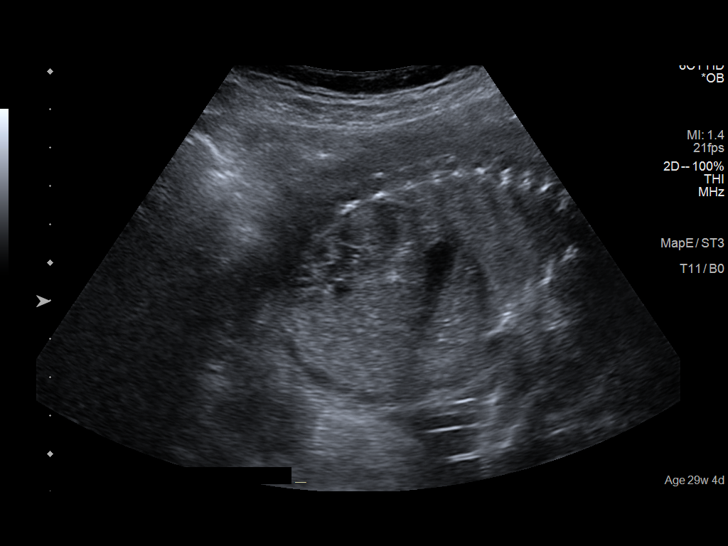
[im 75/107]
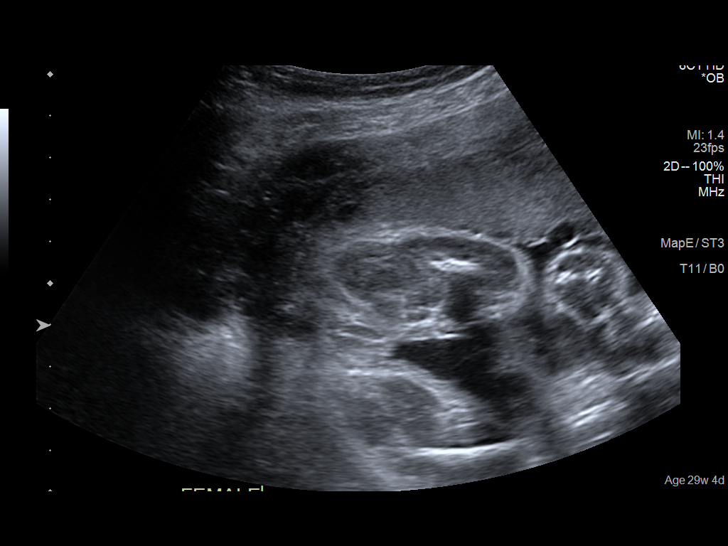
[im 83/107]
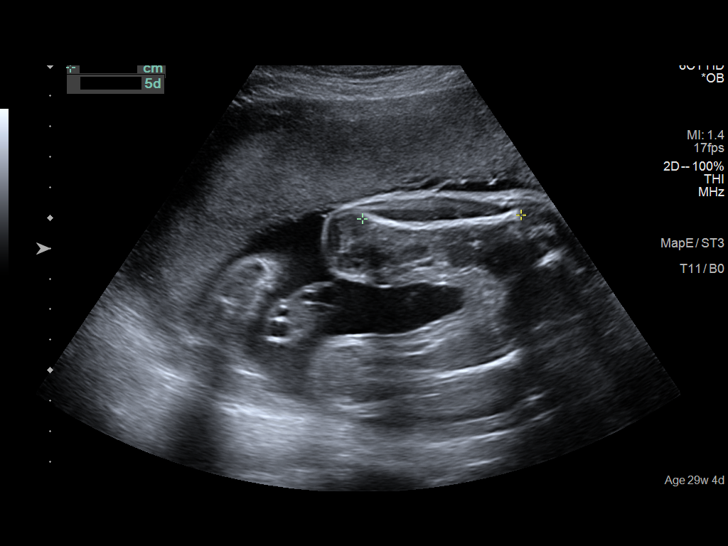
[im 91/107]
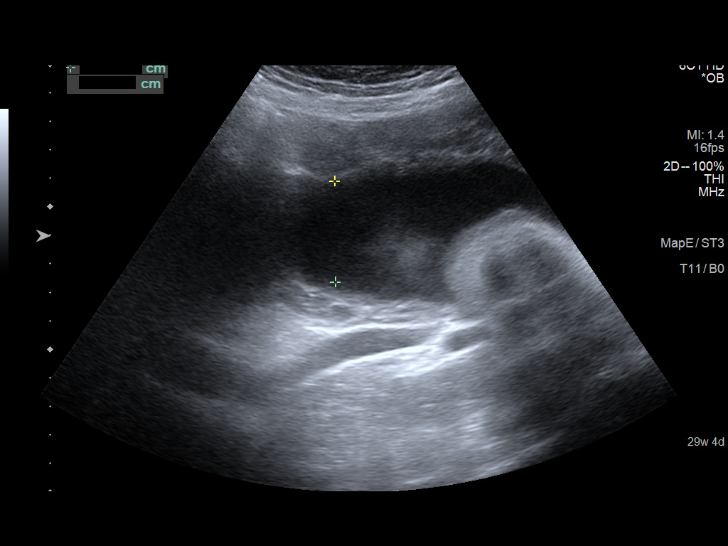
[im 99/107]
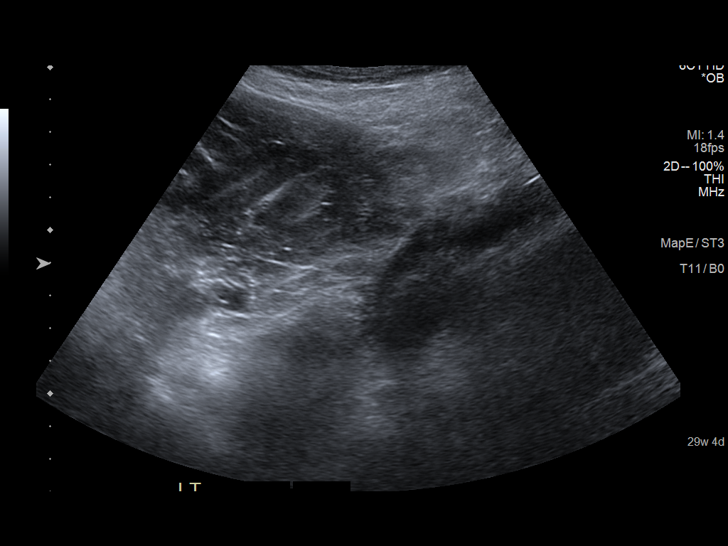
[im 107/107]
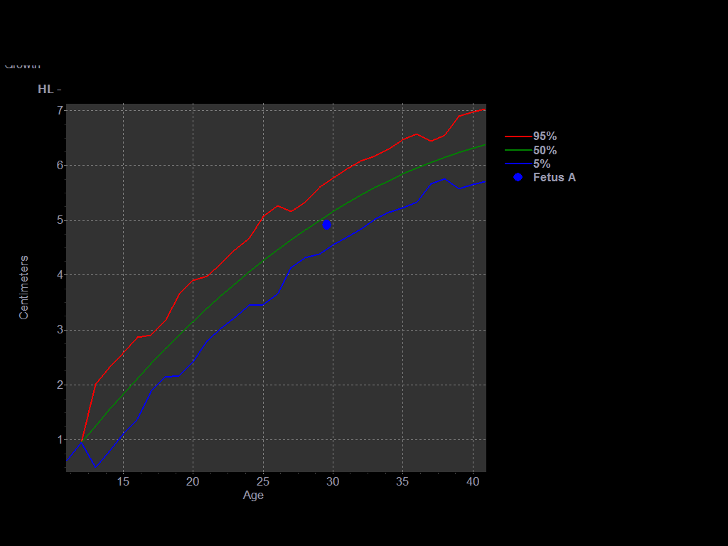

[14 of 28 positions shown; findings below may reference images not displayed]

FINDINGS: Number of Fetuses: 1

Heart Rate:  136 bpm

Movement: yes

Presentation:transverse

Previa:complete

Placental Location: anterior

Amniotic Fluid (Subjective): Normal

AFI 11 cm

BPD: 7.0cm 28w 0d

Maternal Findings:

Cervix:  Closed

Uterus/Adnexae: No abnormality visualized.
IMPRESSION: 1.  Single live intrauterine pregnancy measuring 28 weeks 4 days.
2. Anterior complete placenta previa seen.
3. These results will be called to the ordering clinician or
representative by the Radiologist Assistant, and communication
documented in the PACS or zVision Dashboard.

## 2021-05-24 ENCOUNTER — Emergency Department
Admission: EM | Admit: 2021-05-24 | Discharge: 2021-05-24 | Disposition: A | Discharge status: Home / Self Care | Payer: Medicaid Other | Attending: Emergency Medicine | Admitting: Emergency Medicine

## 2021-05-24 ENCOUNTER — Other Ambulatory Visit: Payer: Self-pay

## 2021-05-24 ENCOUNTER — Encounter: Payer: Self-pay | Admitting: Emergency Medicine

## 2021-05-24 ENCOUNTER — Emergency Department: Payer: Medicaid Other

## 2021-05-24 DIAGNOSIS — Y92009 Unspecified place in unspecified non-institutional (private) residence as the place of occurrence of the external cause: Secondary | ICD-10-CM

## 2021-05-24 DIAGNOSIS — S300XXA Contusion of lower back and pelvis, initial encounter: Secondary | ICD-10-CM

## 2021-05-24 DIAGNOSIS — S39012A Strain of muscle, fascia and tendon of lower back, initial encounter: Secondary | ICD-10-CM | POA: Diagnosis not present

## 2021-05-24 DIAGNOSIS — W1830XA Fall on same level, unspecified, initial encounter: Secondary | ICD-10-CM | POA: Insufficient documentation

## 2021-05-24 DIAGNOSIS — M533 Sacrococcygeal disorders, not elsewhere classified: Secondary | ICD-10-CM | POA: Diagnosis present

## 2021-05-24 DIAGNOSIS — W19XXXA Unspecified fall, initial encounter: Secondary | ICD-10-CM

## 2021-05-24 LAB — POC URINE PREG, ED: Preg Test, Ur: NEGATIVE

## 2021-05-24 MED ORDER — CYCLOBENZAPRINE HCL 5 MG PO TABS: 5.0000 mg | ORAL_TABLET | Freq: Three times a day (TID) | ORAL | 0 refills | Status: DC | PRN

## 2021-05-24 MED ORDER — NABUMETONE 750 MG PO TABS: 750.0000 mg | ORAL_TABLET | Freq: Two times a day (BID) | ORAL | 0 refills | Status: AC

## 2021-05-24 NOTE — Discharge Instructions (Signed)
Your exam and x-rays are negative at this time.  No signs of any acute injury related to the fall.  You may take the prescription anti-inflammatory and muscle relaxant as needed.  You should use an offset pillow (doughnut, neck travel pillow) when seated.  Also a warm shallow bath soak with Epson salt may be helpful.  Follow-up with your primary provider for ongoing symptoms.  Return to the ED if needed.

## 2021-05-24 NOTE — ED Triage Notes (Signed)
Pt comes into the ED via POV c/o fall last night out of a chair.  Pt states the chair broke out from under her and she fell straight down on her bottom.  Pt states she thinks she fractured her coccyx.  Pt ambulatory to triage at this time and in NAD.

## 2021-05-24 NOTE — ED Provider Notes (Signed)
[facility] Provider Note  Patient Contact: 4:42 PM (approximate)   History   Fall   HPI  Toni Blake is a 31 y.o. female with noncontributory medical history, presents to the ED following mechanical fall.  Patient was apparently sitting in a chair that broke loose underneath her, causing her to fall onto her buttocks last night.  She denies any frank head injury or LOC.  She presents with pain to the tailbone as well as some referred pain of the lower back.  She denies any bladder or bowel incontinence, foot drop, or saddle anesthesia.  Physical Exam   Triage Vital Signs: ED Triage Vitals  Enc Vitals Group     BP --      Pulse --      Resp --      Temp --      Temp src --      SpO2 --      Weight 05/24/21 1400 162 lb 0.6 oz (73.5 kg)     Height 05/24/21 1400 5\' 5"  (1.651 m)     Head Circumference --      Peak Flow --      Pain Score 05/24/21 1359 9     Pain Loc --      Pain Edu? --      Excl. in [place]? --     Most recent vital signs: Vitals:   05/24/21 1749  BP: (!) 90/53  Pulse: 65  Resp: 18  Temp: 98.7 F (37.1 C)  SpO2: 100%     General: Alert and in no acute distress. Neck: No stridor. No cervical spine tenderness to palpation. Cardiovascular:  Good peripheral perfusion Respiratory: Normal respiratory effort without tachypnea or retractions. Lungs CTAB.  Gastrointestinal: Bowel sounds 4 quadrants. Soft and nontender to palpation. No guarding or rigidity. No palpable masses. No distention. No CVA tenderness. Musculoskeletal: Full range of motion to all extremities. Normal spinal alignment without deformity  Neurologic:  No gross focal neurologic deficits are appreciated. Normal LE DTRs. Skin:   No rash noted  ED Results / Procedures / Treatments   Labs (all labs ordered are listed, but only abnormal results are displayed) Labs Reviewed  POC URINE PREG, ED     EKG    RADIOLOGY  I personally viewed and evaluated  these images as part of my medical decision making, as well as reviewing the written report by the radiologist.  ED Provider Interpretation: no acute fracture  DG Lumbar Spine Complete  Result Date: 05/24/2021 CLINICAL DATA:  Lower back pain after falling out of broken chair. EXAM: LUMBAR SPINE - COMPLETE 4+ VIEW COMPARISON:  None. FINDINGS: There are 5 non-rib-bearing lumbar-type vertebral bodies. Minimal dextrocurvature centered at L3. Normal sagittal alignment. Vertebral body heights are maintained. Minimal L5-S1 disc space narrowing. No definite pars defect is seen. IMPRESSION: No acute fracture.  Normal alignment. Electronically Signed   By: Yvonne Kendall   On: 05/24/2021 17:18   DG Sacrum/Coccyx  Result Date: 05/24/2021 CLINICAL DATA:  Pain after fall.  [place] out of a chair onto coccyx. EXAM: SACRUM AND COCCYX - 2+ VIEW COMPARISON:  None. FINDINGS: Mild pubic symphysis degenerative subchondral irregularity. There are tiny ossicles anterior to the right and left pubic bodies on oblique view that are favored to be chronic. Recommend correlation for point tenderness. The bilateral femoroacetabular and sacroiliac joint spaces are maintained. Normal alignment of the sacrum on lateral view. No definite acute fracture. Bilateral tubal ligation clips  are seen. IMPRESSION: No definite acute fracture. Individual foci of mineralization anterior to the right and left pubic bodies seen on oblique frontal view are favored to be chronic. Recommend clinical correlation for point tenderness. Electronically Signed   By: Yvonne Kendall   On: 05/24/2021 17:17    PROCEDURES:  Critical Care performed: No  Procedures   MEDICATIONS ORDERED IN ED: Medications - No data to display   IMPRESSION / MDM / [place] / ED COURSE  I reviewed the triage vital signs and the nursing notes.                              Differential diagnosis includes, but is not limited to, coccyx injury, lumbar strain, lumbar  compression fracture, hip dislocation  Patient ED evaluation of injury sustained following mechanical fall at that she has she was given and gave way, causing her to land on her buttocks last night.  Patient denies any head injury or LOC.  She also denies any bladder or bowel dysfunction or saddle anesthesias.  Exam is reassuring and benign, and no radiologic evidence of any acute fracture or dislocation is seen by me on plain films.  Patient will be discharged with instructions to manage injuries like a muscle strain, and will follow-up with her PCP.  Patient's diagnosis is consistent with fall and contusion. Patient will be discharged home with prescriptions for Flexeril and Relafen. Patient is to follow up with her PCP as needed or otherwise directed. Patient is given ED precautions to return to the ED for any worsening or new symptoms.   FINAL CLINICAL IMPRESSION(S) / ED DIAGNOSES   Final diagnoses:  Fall in home, initial encounter  Coccyx contusion, initial encounter  Lumbar strain, initial encounter     Rx / DC Orders   ED Discharge Orders          Ordered    cyclobenzaprine (FLEXERIL) 5 MG tablet  3 times daily PRN        05/24/21 1737    nabumetone (RELAFEN) 750 MG tablet  2 times daily        05/24/21 1737             Note:  This document was prepared using Dragon voice recognition software and may include unintentional dictation errors.    Melvenia Needles, PA-C 05/24/21 1837    Nance Pear, MD 05/24/21 [phone]

## 2022-04-06 IMAGING — CR DG LUMBAR SPINE COMPLETE 4+V
1 series · 5 of 5 positions shown · non-contrast
Comparison: None.

CLINICAL DATA: Lower back pain after falling out of broken chair.

EXAM:
LUMBAR SPINE - COMPLETE 4+ VIEW

[Series 1: dg lumbar spine complete 4 +v · 0.14mm/px · 5 of 5 slices shown]
[im 1/5]
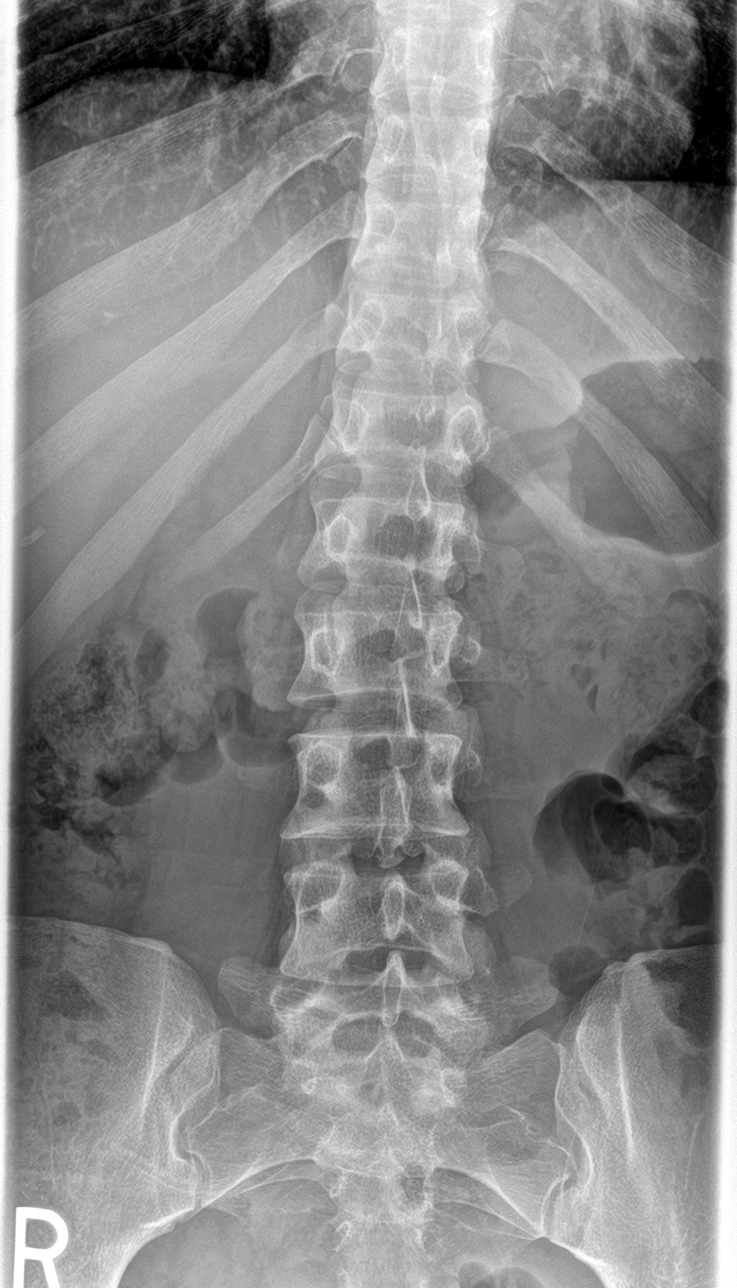
[im 2/5]
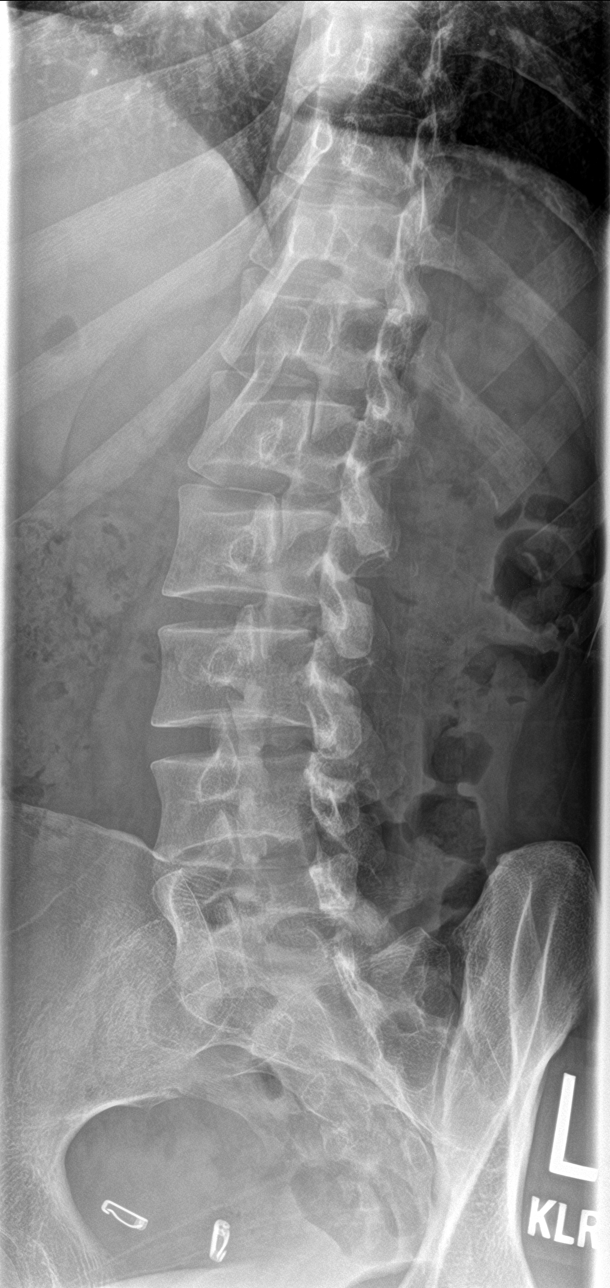
[im 3/5]
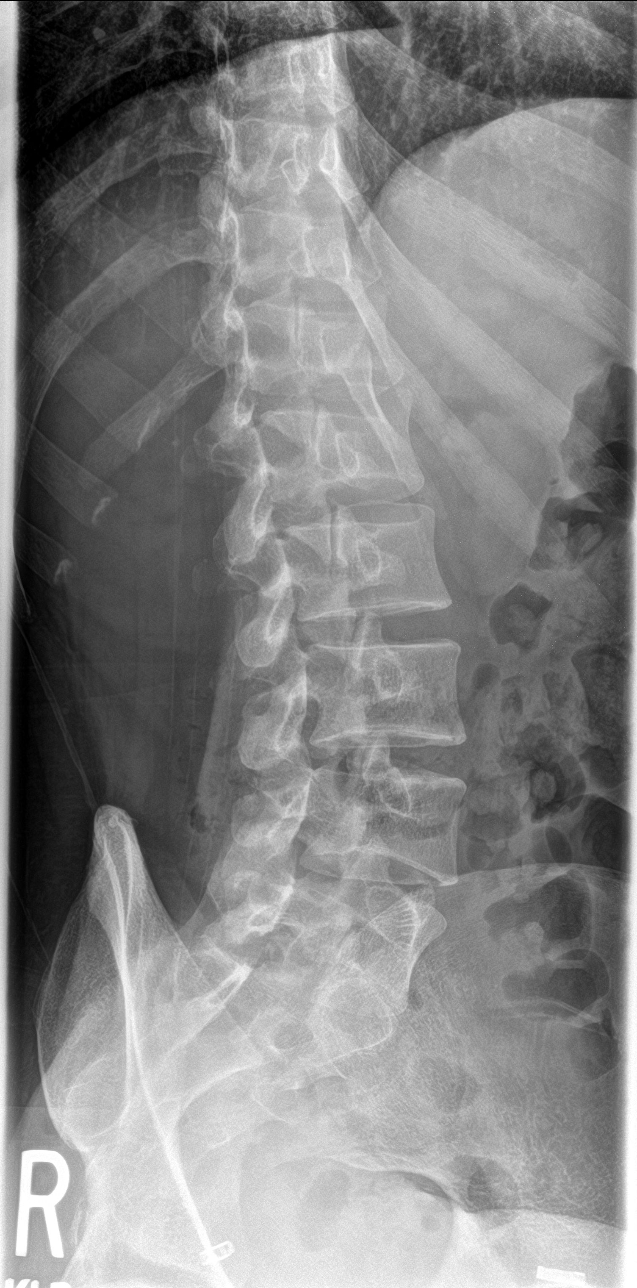
[im 4/5]
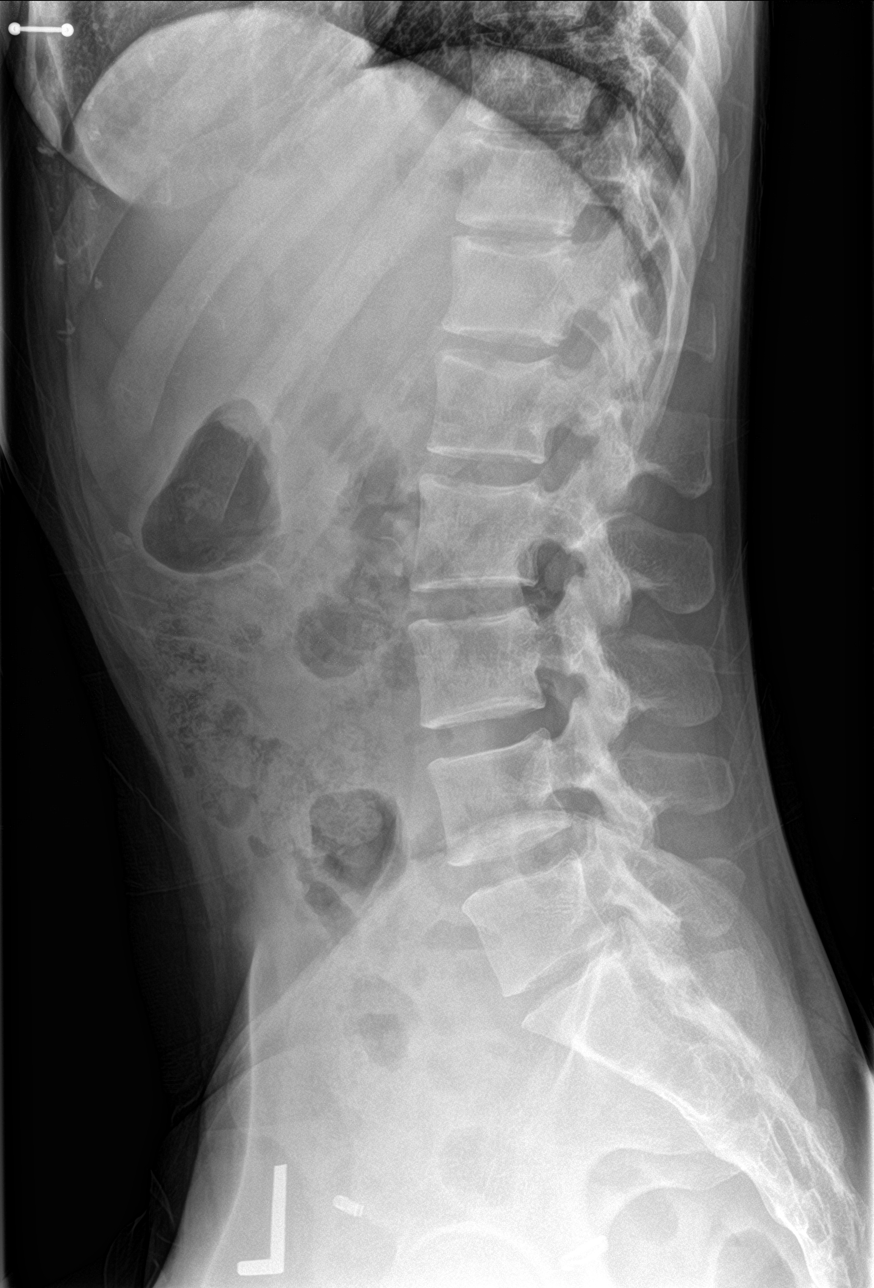
[im 5/5]
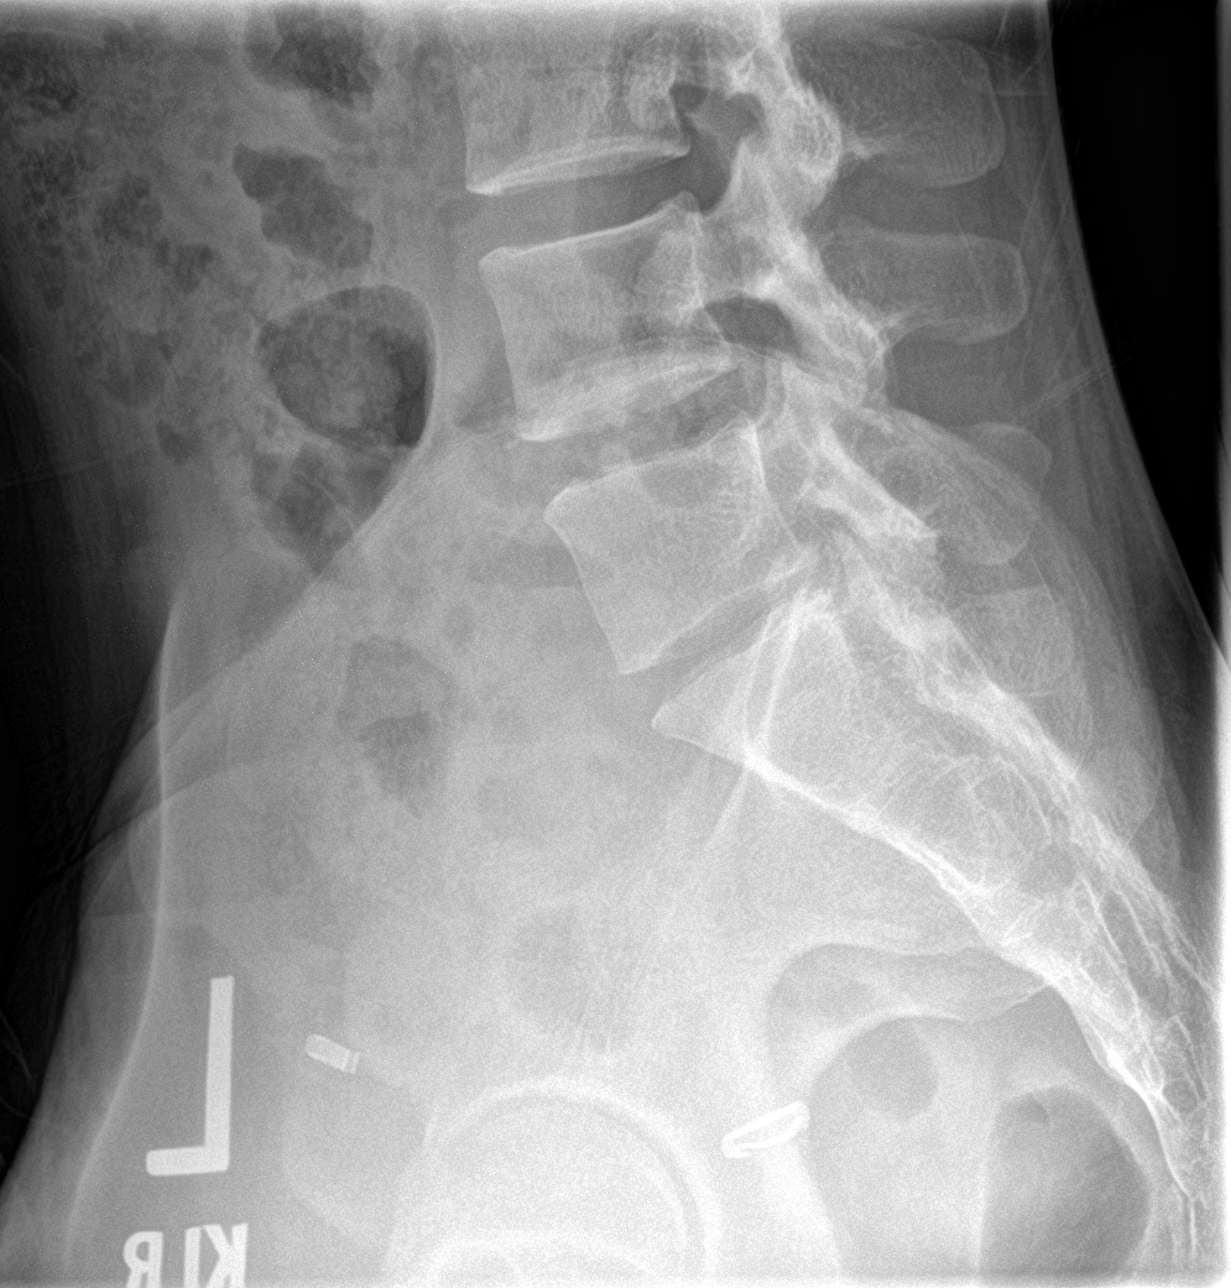

[5 of 5 positions shown; findings below may reference images not displayed]

FINDINGS: There are 5 non-rib-bearing lumbar-type vertebral bodies. Minimal
dextrocurvature centered at L3. Normal sagittal alignment. Vertebral
body heights are maintained. Minimal L5-S1 disc space narrowing. No
definite pars defect is seen.
IMPRESSION: No acute fracture.  Normal alignment.

## 2023-06-09 ENCOUNTER — Emergency Department
Admission: EM | Admit: 2023-06-09 | Discharge: 2023-06-09 | Disposition: A | Discharge status: Home / Self Care | Payer: Medicaid Other | Attending: Emergency Medicine | Admitting: Emergency Medicine

## 2023-06-09 DIAGNOSIS — M545 Low back pain, unspecified: Secondary | ICD-10-CM | POA: Diagnosis present

## 2023-06-09 DIAGNOSIS — D649 Anemia, unspecified: Secondary | ICD-10-CM | POA: Insufficient documentation

## 2023-06-09 MED ORDER — METHOCARBAMOL 500 MG PO TABS: 1000.0000 mg | ORAL_TABLET | Freq: Three times a day (TID) | ORAL | 0 refills | Status: AC

## 2023-06-09 NOTE — ED Provider Notes (Signed)
[facility] Provider Note    Event Date/Time   First MD Initiated Contact with Patient 06/09/23 1523     (approximate)   History   Back Pain   HPI  Toni Blake is a 33 y.o. female with PMH of migraine, depression, anxiety, anemia presents for evaluation of back pain.  Patient states that her pain began 2 days ago.  She is not sure what could have caused the, thought she maybe had slept wrong.  Denies any specific trauma and falls.  No numbness, weakness or tingling down the legs.  No changes in bladder or bowel function.  No fevers.  She states that when she steps with her left foot she has pain that shoots up her back.  At home she has tried taking Tylenol, ibuprofen, warm baths, heating pad and stretching.      Physical Exam   Triage Vital Signs: ED Triage Vitals  Encounter Vitals Group     BP 06/09/23 1424 120/75     Systolic BP Percentile --      Diastolic BP Percentile --      Pulse Rate 06/09/23 1424 82     Resp 06/09/23 1424 18     Temp 06/09/23 1424 98 F (36.7 C)     Temp Source 06/09/23 1424 Oral     SpO2 06/09/23 1424 97 %     Weight 06/09/23 1426 162 lb (73.5 kg)     Height 06/09/23 1426 5\' 5"  (1.651 m)     Head Circumference --      Peak Flow --      Pain Score 06/09/23 1425 4     Pain Loc --      Pain Education --      Exclude from Growth Chart --     Most recent vital signs: Vitals:   06/09/23 1424  BP: 120/75  Pulse: 82  Resp: 18  Temp: 98 F (36.7 C)  SpO2: 97%   General: Awake, no distress.  CV:  Good peripheral perfusion.  Resp:  Normal effort.  Abd:  No distention.  Other:  No tenderness to palpation over the vertebral spines, tender to palpation along the paraspinal muscles in the lower back bilaterally, no weakness in bilateral lower extremities, sensation is intact in the extremities.  Patient able to walk without difficulty.   ED Results / Procedures / Treatments   Labs (all labs ordered are  listed, but only abnormal results are displayed) Labs Reviewed - No data to display  PROCEDURES:  Critical Care performed: No  Procedures   MEDICATIONS ORDERED IN ED: Medications - No data to display   IMPRESSION / MDM / ASSESSMENT AND PLAN / ED COURSE  I reviewed the triage vital signs and the nursing notes.                             33 year old female presents for evaluation of back pain.  Vital signs are stable and patient is NAD on exam.  Differential diagnosis includes, but is not limited to, muscle strain, ligament strain, sciatica, fracture, cauda equina.  Patient's presentation is most consistent with acute, uncomplicated illness.  Do not feel that x-rays are indicated at this time given patient has not had any trauma.  Physical exam is reassuring.  Patient's history is consistent with a muscle strain.  We discussed symptomatic management using Tylenol, ibuprofen, heating pads, topical pain relievers and Epsom salt  baths.  I will also send a muscle relaxer for her.  Patient was agreeable to plan, voiced understanding and was stable at discharge.      FINAL CLINICAL IMPRESSION(S) / ED DIAGNOSES   Final diagnoses:  Acute bilateral low back pain without sciatica     Rx / DC Orders   ED Discharge Orders          Ordered    methocarbamol (ROBAXIN) 500 MG tablet  3 times daily        06/09/23 1538             Note:  This document was prepared using Dragon voice recognition software and may include unintentional dictation errors.   Cameron Ali, PA-C 06/09/23 1540    Trinna Post, MD 06/09/23 1932

## 2023-06-09 NOTE — Discharge Instructions (Signed)
You can take 650 mg of Tylenol and 600 mg of ibuprofen every 6 hours as needed for pain.  You can take the muscle relaxer 3 times daily as needed. Do not drive after taking it as this medication can be sedating.  Continue to stretch, ice, heat and take warm baths. I recommend adding epsom salt to the bath. You can also try topical pain relievers like tiger balm, icy hot, biofreeze, etc.  Return to the ED with any new or worsening symptoms.

## 2023-06-09 NOTE — ED Provider Triage Note (Signed)
Emergency Medicine Provider Triage Evaluation Note  Toni Blake , a 33 y.o. female  was evaluated in triage.  Pt complains of transverse low back pain without known injury. No history of chronic back pain. No dysuria. Pain worsens with movement. Physical Exam  BP 120/75 (BP Location: Left Arm)   Pulse 82   Temp 98 F (36.7 C) (Oral)   Resp 18   Ht 5\' 5"  (1.651 m)   Wt 73.5 kg   SpO2 97%   BMI 26.96 kg/m  Gen:   Awake, no distress   Resp:  Normal effort  MSK:   Moves extremities without difficulty. No focal midline tenderness lumbar spine. Other:    Medical Decision Making  Medically screening exam initiated at 2:29 PM.  Appropriate orders placed.  Toni Blake was informed that the remainder of the evaluation will be completed by another provider, this initial triage assessment does not replace that evaluation, and the importance of remaining in the ED until their evaluation is complete.    Chinita Pester, FNP 06/09/23 1430

## 2023-06-09 NOTE — ED Triage Notes (Addendum)
Pt c/o lower back pain x2 days.  Pain score 4/10. Pt reports pain increased w/ ambulation and pain shoots up leg and into back when she steps w/ her L foot.  Pt reports lower back is "swollen."    Denies injury and new pushing/pulling/lifting.  Denies GU/GI complaints.

## 2024-01-30 ENCOUNTER — Emergency Department

## 2024-01-30 ENCOUNTER — Other Ambulatory Visit: Payer: Self-pay

## 2024-01-30 ENCOUNTER — Emergency Department
Admission: EM | Admit: 2024-01-30 | Discharge: 2024-01-30 | Disposition: A | Discharge status: Home / Self Care | Attending: Emergency Medicine | Admitting: Emergency Medicine

## 2024-01-30 DIAGNOSIS — R519 Headache, unspecified: Secondary | ICD-10-CM | POA: Insufficient documentation

## 2024-01-30 DIAGNOSIS — S43101A Unspecified dislocation of right acromioclavicular joint, initial encounter: Secondary | ICD-10-CM | POA: Diagnosis not present

## 2024-01-30 DIAGNOSIS — S4991XA Unspecified injury of right shoulder and upper arm, initial encounter: Secondary | ICD-10-CM | POA: Diagnosis present

## 2024-01-30 DIAGNOSIS — Y92410 Unspecified street and highway as the place of occurrence of the external cause: Secondary | ICD-10-CM | POA: Insufficient documentation
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
## 2024-05-20 ENCOUNTER — Encounter: Payer: Self-pay | Admitting: Intensive Care

## 2024-05-20 ENCOUNTER — Emergency Department
Admission: EM | Admit: 2024-05-20 | Discharge: 2024-05-20 | Disposition: A | Discharge status: Home / Self Care | Payer: Self-pay | Attending: Emergency Medicine | Admitting: Emergency Medicine

## 2024-05-20 ENCOUNTER — Other Ambulatory Visit: Payer: Self-pay

## 2024-05-20 DIAGNOSIS — L02415 Cutaneous abscess of right lower limb: Secondary | ICD-10-CM | POA: Insufficient documentation

## 2024-05-20 MED ORDER — SULFAMETHOXAZOLE-TRIMETHOPRIM 800-160 MG PO TABS: 1.0000 | ORAL_TABLET | Freq: Two times a day (BID) | ORAL | 0 refills | Status: AC

## 2024-05-20 MED ORDER — CEPHALEXIN 500 MG PO CAPS: 500.0000 mg | ORAL_CAPSULE | Freq: Four times a day (QID) | ORAL | 0 refills | Status: AC

## 2024-05-20 NOTE — Discharge Instructions (Addendum)
 You have an infection on your left thigh.  It is not quite an abscess yet but does require antibiotics.  Please take these medications as prescribed.  Make sure you take all of the medications even if you begin to feel better.  I also recommend doing warm compresses at least twice a day.  This may encourage it to drain on its own.  If after 48 hours things are not improving please return to the emergency department, urgent care or your primary care provider.  Occasionally antibiotics cause the infection to coalesce into something that can be cut open and drained.  If this occurs please come back to the ED.

## 2024-05-20 NOTE — ED Provider Notes (Signed)
 "  [facility] Provider Note    Event Date/Time   First MD Initiated Contact with Patient 05/20/24 1354     (approximate)   History   Abscess   HPI  Toni Blake is a 34 y.o. female with PMH of migraine, depression, anxiety presents for evaluation of an abscess to the right thigh.  Patient noticed it a few days ago.  She is not had any drainage.  She reports there is redness and warmth around the area.  No fevers.  Patient states that she has had other abscesses like this and thinks that it may be due to her water.      Physical Exam   Triage Vital Signs: ED Triage Vitals  Encounter Vitals Group     BP 05/20/24 1245 117/81     Girls Systolic BP Percentile --      Girls Diastolic BP Percentile --      Boys Systolic BP Percentile --      Boys Diastolic BP Percentile --      Pulse Rate 05/20/24 1245 93     Resp 05/20/24 1245 16     Temp 05/20/24 1245 98.4 F (36.9 C)     Temp Source 05/20/24 1245 Oral     SpO2 05/20/24 1245 100 %     Weight 05/20/24 1243 160 lb (72.6 kg)     Height 05/20/24 1243 5' 5 (1.651 m)     Head Circumference --      Peak Flow --      Pain Score 05/20/24 1243 6     Pain Loc --      Pain Education --      Exclude from Growth Chart --     Most recent vital signs: Vitals:   05/20/24 1245  BP: 117/81  Pulse: 93  Resp: 16  Temp: 98.4 F (36.9 C)  SpO2: 100%   General: Awake, no distress.  CV:  Good peripheral perfusion.  Resp:  Normal effort.  Abd:  No distention.  Other:  Nodule to the right anterior thigh with overlying scab and surrounding erythema, warmth and induration but no fluctuance   ED Results / Procedures / Treatments   Labs (all labs ordered are listed, but only abnormal results are displayed) Labs Reviewed - No data to display  PROCEDURES:  Critical Care performed: No  Procedures   MEDICATIONS ORDERED IN ED: Medications - No data to display   IMPRESSION / MDM / ASSESSMENT AND  PLAN / ED COURSE  I reviewed the triage vital signs and the nursing notes.                             34 year old female presents for evaluation of an abscess to the right side.  Vital signs are stable patient NAD on exam.  Differential diagnosis includes, but is not limited to, abscess, folliculitis, cellulitis.  Patient's presentation is most consistent with acute, uncomplicated illness.  Patient has a nodule in the right thigh with surrounding erythema, warmth and induration consistent with cellulitis.  The nodule itself is not fluctuant so I do not feel that there is a fluid collection to drain.  Area does appear infected.  Did advise patient to use warm compresses to encourage it to drain on its own.  Will start her on oral antibiotics.  Discussed return precautions.  She voiced understanding, all questions were answered and she was stable at discharge.  FINAL CLINICAL IMPRESSION(S) / ED DIAGNOSES   Final diagnoses:  Abscess of right thigh     Rx / DC Orders   ED Discharge Orders          Ordered    cephALEXin  (KEFLEX ) 500 MG capsule  4 times daily        05/20/24 1502    sulfamethoxazole -trimethoprim  (BACTRIM  DS) 800-160 MG tablet  2 times daily        05/20/24 1502             Note:  This document was prepared using Dragon voice recognition software and may include unintentional dictation errors.   Cleaster Tinnie LABOR, PA-C 05/20/24 1505    Dorothyann Drivers, MD 05/21/24 1705  "

## 2024-05-20 NOTE — ED Triage Notes (Signed)
 Patient presents with abscess on right thigh. Dimple in middle of abscess that is black in color. Denies drainage. Redness around area
